# Patient Record
Sex: Female | Born: 1940
Health system: Southern US, Community
[De-identification: ages and names within clinical notes are randomized; demographics above are authoritative.]

## PROBLEM LIST (undated history)

## (undated) DIAGNOSIS — Z86010 Personal history of colonic polyps: Secondary | ICD-10-CM

## (undated) DIAGNOSIS — E079 Disorder of thyroid, unspecified: Secondary | ICD-10-CM

## (undated) DIAGNOSIS — K5792 Diverticulitis of intestine, part unspecified, without perforation or abscess without bleeding: Secondary | ICD-10-CM

## (undated) DIAGNOSIS — N200 Calculus of kidney: Secondary | ICD-10-CM

## (undated) DIAGNOSIS — R05 Cough: Secondary | ICD-10-CM

## (undated) DIAGNOSIS — R059 Cough, unspecified: Secondary | ICD-10-CM

## (undated) DIAGNOSIS — K222 Esophageal obstruction: Secondary | ICD-10-CM

## (undated) DIAGNOSIS — K219 Gastro-esophageal reflux disease without esophagitis: Secondary | ICD-10-CM

## (undated) DIAGNOSIS — J439 Emphysema, unspecified: Secondary | ICD-10-CM

## (undated) DIAGNOSIS — B3781 Candidal esophagitis: Secondary | ICD-10-CM

## (undated) DIAGNOSIS — T7840XA Allergy, unspecified, initial encounter: Secondary | ICD-10-CM

## (undated) DIAGNOSIS — K589 Irritable bowel syndrome without diarrhea: Secondary | ICD-10-CM

## (undated) HISTORY — DX: Calculus of kidney: N20.0

## (undated) HISTORY — PX: ESOPHAGEAL DILATION: SHX303

## (undated) HISTORY — DX: Irritable bowel syndrome without diarrhea: K58.9

## (undated) HISTORY — DX: Candidal esophagitis: B37.81

## (undated) HISTORY — PX: TUBAL LIGATION: SHX77

## (undated) HISTORY — DX: Cough, unspecified: R05.9

## (undated) HISTORY — PX: OTHER SURGICAL HISTORY: SHX169

## (undated) HISTORY — PX: UPPER GASTROINTESTINAL ENDOSCOPY: SHX188

## (undated) HISTORY — DX: Personal history of colonic polyps: Z86.010

## (undated) HISTORY — DX: Diverticulitis of intestine, part unspecified, without perforation or abscess without bleeding: K57.92

## (undated) HISTORY — DX: Allergy, unspecified, initial encounter: T78.40XA

## (undated) HISTORY — DX: Emphysema, unspecified: J43.9

## (undated) HISTORY — PX: HYSTEROTOMY: SHX1776

## (undated) HISTORY — PX: THYROIDECTOMY, PARTIAL: SHX18

## (undated) HISTORY — DX: Cough: R05

## (undated) HISTORY — PX: COLONOSCOPY: SHX174

## (undated) HISTORY — PX: FACIAL RECONSTRUCTION SURGERY: SHX631

## (undated) HISTORY — DX: Gastro-esophageal reflux disease without esophagitis: K21.9

---

## 1998-05-08 ENCOUNTER — Other Ambulatory Visit: Admission: RE | Admit: 1998-05-08 | Discharge: 1998-05-08 | Payer: Self-pay | Admitting: *Deleted

## 2002-08-05 ENCOUNTER — Encounter: Payer: Self-pay | Admitting: Emergency Medicine

## 2002-08-05 ENCOUNTER — Emergency Department (HOSPITAL_COMMUNITY): Admission: EM | Admit: 2002-08-05 | Discharge: 2002-08-05 | Payer: Self-pay | Admitting: Emergency Medicine

## 2003-01-23 ENCOUNTER — Encounter: Payer: Self-pay | Admitting: Emergency Medicine

## 2003-01-23 ENCOUNTER — Emergency Department (HOSPITAL_COMMUNITY): Admission: EM | Admit: 2003-01-23 | Discharge: 2003-01-23 | Payer: Self-pay | Admitting: *Deleted

## 2005-12-15 ENCOUNTER — Ambulatory Visit (HOSPITAL_COMMUNITY): Admission: RE | Admit: 2005-12-15 | Discharge: 2005-12-15 | Payer: Self-pay | Admitting: Family Medicine

## 2006-03-19 ENCOUNTER — Encounter: Admission: RE | Admit: 2006-03-19 | Discharge: 2006-03-19 | Payer: Self-pay | Admitting: Family Medicine

## 2006-09-12 ENCOUNTER — Emergency Department (HOSPITAL_COMMUNITY): Admission: EM | Admit: 2006-09-12 | Discharge: 2006-09-12 | Payer: Self-pay | Admitting: Family Medicine

## 2007-02-21 ENCOUNTER — Emergency Department (HOSPITAL_COMMUNITY): Admission: EM | Admit: 2007-02-21 | Discharge: 2007-02-21 | Payer: Self-pay | Admitting: Family Medicine

## 2010-03-06 ENCOUNTER — Encounter: Admission: RE | Admit: 2010-03-06 | Discharge: 2010-03-06 | Payer: Self-pay | Admitting: Otolaryngology

## 2011-04-28 ENCOUNTER — Other Ambulatory Visit: Payer: Self-pay | Admitting: Family Medicine

## 2011-04-30 ENCOUNTER — Ambulatory Visit
Admission: RE | Admit: 2011-04-30 | Discharge: 2011-04-30 | Disposition: A | Discharge status: Home / Self Care | Payer: Medicare Other | Source: Ambulatory Visit | Attending: Family Medicine | Admitting: Family Medicine

## 2013-02-22 ENCOUNTER — Emergency Department (HOSPITAL_COMMUNITY)
Admission: EM | Admit: 2013-02-22 | Discharge: 2013-02-22 | Disposition: A | Discharge status: Home / Self Care | Payer: Medicare Other | Source: Home / Self Care

## 2013-02-22 ENCOUNTER — Encounter (HOSPITAL_COMMUNITY): Payer: Self-pay | Admitting: Emergency Medicine

## 2013-02-22 DIAGNOSIS — T07XXXA Unspecified multiple injuries, initial encounter: Secondary | ICD-10-CM

## 2013-02-22 DIAGNOSIS — W64XXXA Exposure to other animate mechanical forces, initial encounter: Secondary | ICD-10-CM

## 2013-02-22 HISTORY — DX: Disorder of thyroid, unspecified: E07.9

## 2013-02-22 MED ORDER — CLARITHROMYCIN 500 MG PO TABS: 500.0000 mg | ORAL_TABLET | Freq: Two times a day (BID) | ORAL | Status: DC

## 2013-02-22 NOTE — ED Provider Notes (Signed)
Medical screening examination/treatment/procedure(s) were performed by non-physician practitioner and as supervising physician I was immediately available for consultation/collaboration.  Leslee Home, M.D.  Reuben Likes, MD 02/22/13 2037

## 2013-02-22 NOTE — ED Provider Notes (Signed)
History     CSN: [id number]  Arrival date & time 02/22/13  1718   First MD Initiated Contact with Patient 02/22/13 1847      Chief Complaint  Patient presents with  . Animal Bite    cat scratche back of left ankle yesterday.     (Consider location/radiation/quality/duration/timing/severity/associated sxs/prior treatment) HPI Comments: 72 year old female was showing a house to a potential buyer when a cat came up behind her and either scratched her or to her. This produced 3 small puncture wounds. This occurred yesterday at 3:30 PM there is no erythema, swelling or other discoloration. There is no drainage or oozing. She clean the wound with soap and water and scrubbed it at the time. She is unsure about her T. gap that we will call her physician tomorrow regarding that. If she needs it and she will obtain it from the PCP.   Past Medical History  Diagnosis Date  . Thyroid disease     Past Surgical History  Procedure Laterality Date  . Facial reconstruction surgery      History reviewed. No pertinent family history.  History  Substance Use Topics  . Smoking status: Never Smoker   . Smokeless tobacco: Not on file  . Alcohol Use: Yes    OB History   Grav Para Term Preterm Abortions TAB SAB Ect Mult Living                  Review of Systems  Constitutional: Negative.   Respiratory: Negative.   Cardiovascular: Negative.   Gastrointestinal: Negative.   Skin: Positive for wound.  Neurological: Negative.     Allergies  Iohexol  Home Medications   Current Outpatient Rx  Name  Route  Sig  Dispense  Refill  . levothyroxine (SYNTHROID, LEVOTHROID) 88 MCG tablet   Oral   Take 88 mcg by mouth daily before breakfast.         . clarithromycin (BIAXIN) 500 MG tablet   Oral   Take 1 tablet (500 mg total) by mouth 2 (two) times daily. X 7 days   14 tablet   0     BP 108/63  Pulse 65  Temp(Src) 98.3 F (36.8 C) (Oral)  Resp 16  SpO2 99%  Physical Exam   Nursing note and vitals reviewed. Constitutional: She is oriented to person, place, and time. She appears well-developed and well-nourished. No distress.  Neck: Normal range of motion. Neck supple.  Pulmonary/Chest: Effort normal.  Musculoskeletal: Normal range of motion. She exhibits no edema.  Neurological: She is alert and oriented to person, place, and time. She exhibits normal muscle tone.  Skin: Skin is warm and dry. No erythema.  3 very small puncture wounds located in the back of the right lower leg just above the ankle. No signs of infection. No discoloration or swelling. There is no drainage from the wounds.  Psychiatric: She has a normal mood and affect.    ED Course  Procedures (including critical care time)  Labs Reviewed - No data to display No results found.   1. Cat scratch, initial encounter       MDM  Biaxin 500 mg twice a day for 7 days Keep the wound clean with soap and water daily He denies any erythema, puffiness, red streaks, drainage or other signs of infection sig medical attention promptly. Animal control was called today and traps were set out. They are investigating attempting to on the owner. Prophylactically the patient was started on antibiotics.  She has a lengthy list of medication allergies which include nearly all antibiotics with the exception of clarithromycin. Reactions to the antibiotics include angioedema, syncope, airway obstruction and hypertension.  clarithromycin will cover for strep, staph, Bartonella species however pastoral is not mentioned. Azithromycin will cover Pasteurella, possibly clarithromycin may also. For any redness, puffiness red streaks or any signs of infection seek medical care promptly.   Hayden Rasmussen, NP 02/22/13 1930

## 2013-02-22 NOTE — ED Notes (Signed)
Pt reports was scratched by a cat yesterday on the back of left leg just above left ankle. Pt denies any symptoms. Wants to be checked out.

## 2013-03-04 ENCOUNTER — Emergency Department (HOSPITAL_COMMUNITY)
Admission: EM | Admit: 2013-03-04 | Discharge: 2013-03-04 | Disposition: A | Discharge status: Home / Self Care | Payer: Medicare Other | Attending: Emergency Medicine | Admitting: Emergency Medicine

## 2013-03-04 ENCOUNTER — Encounter (HOSPITAL_COMMUNITY): Payer: Self-pay | Admitting: Emergency Medicine

## 2013-03-04 DIAGNOSIS — R51 Headache: Secondary | ICD-10-CM | POA: Insufficient documentation

## 2013-03-04 DIAGNOSIS — Z79899 Other long term (current) drug therapy: Secondary | ICD-10-CM | POA: Insufficient documentation

## 2013-03-04 DIAGNOSIS — Z87828 Personal history of other (healed) physical injury and trauma: Secondary | ICD-10-CM | POA: Insufficient documentation

## 2013-03-04 DIAGNOSIS — H6120 Impacted cerumen, unspecified ear: Secondary | ICD-10-CM | POA: Insufficient documentation

## 2013-03-04 DIAGNOSIS — R110 Nausea: Secondary | ICD-10-CM | POA: Insufficient documentation

## 2013-03-04 DIAGNOSIS — E079 Disorder of thyroid, unspecified: Secondary | ICD-10-CM | POA: Insufficient documentation

## 2013-03-04 LAB — POCT I-STAT, CHEM 8
BUN: 12 mg/dL (ref 6–23)
Calcium, Ion: 1.14 mmol/L (ref 1.13–1.30)
Creatinine, Ser: 0.8 mg/dL (ref 0.50–1.10)
Glucose, Bld: 96 mg/dL (ref 70–99)
Sodium: 141 mEq/L (ref 135–145)
TCO2: 29 mmol/L (ref 0–100)

## 2013-03-04 LAB — CBC WITH DIFFERENTIAL/PLATELET
Eosinophils Relative: 3 % (ref 0–5)
HCT: 41.9 % (ref 36.0–46.0)
Hemoglobin: 14.3 g/dL (ref 12.0–15.0)
Lymphocytes Relative: 43 % (ref 12–46)
Lymphs Abs: 3.6 10*3/uL (ref 0.7–4.0)
MCH: 30.3 pg (ref 26.0–34.0)
MCV: 88.8 fL (ref 78.0–100.0)
Monocytes Absolute: 0.8 10*3/uL (ref 0.1–1.0)
Monocytes Relative: 10 % (ref 3–12)
RBC: 4.72 MIL/uL (ref 3.87–5.11)
WBC: 8.2 10*3/uL (ref 4.0–10.5)

## 2013-03-04 NOTE — ED Notes (Addendum)
Pt alert, NAD, calm, interactive, skin W&D, resps e/u, speaking in clear complete sentences, ambulatory with steady gait, family with pt.  VSS/WNL x2, States, "feel better, nausea lessened", also "have developed back pain from sitting in w/r chair".

## 2013-03-04 NOTE — ED Notes (Signed)
Pt c/o nausea today; pt recently scratched by unknown cat; pt sts swollen glands on face; pt sts wound from cat scratch is healed; pt sts finished antibiotics on Wednesday

## 2013-03-04 NOTE — ED Provider Notes (Signed)
History    This chart was scribed for non-physician practitioner working with Glynn Octave, MD by Donne Anon, ED Scribe. This patient was seen in room TR09C/TR09C and the patient's care was started at 2221.   CSN: [id number]  Arrival date & time 03/04/13  1640   First MD Initiated Contact with Patient 03/04/13 2221      Chief Complaint  Patient presents with  . Nausea     HPI HPI Comments: Anita [name] is a 72 y.o. female who presents to the Emergency Department complaining of gradual onset nausea which began today. She states she was scratched by an unknown cat on 02/21/13 and seen in urgent care on 02/22/13 where she was prescribed 1 week of antibiotics and a tetanus shot. 3 days ago she began experiencing fatigue, "shakiness," abdominal distension (baseline, but worse than normal), headache and a knot of both sides of her cheeks that is tender to palpation. She denies fever, ear pain, rhinorrhea, sore throat, diarrhea, vomiting, or any other pain. Her last DM was this morning and was normal.  Past Medical History  Diagnosis Date  . Thyroid disease     Past Surgical History  Procedure Laterality Date  . Facial reconstruction surgery      History reviewed. No pertinent family history.  History  Substance Use Topics  . Smoking status: Never Smoker   . Smokeless tobacco: Not on file  . Alcohol Use: Yes     Review of Systems  Constitutional: Negative for fever.  HENT: Negative for ear pain, sore throat and rhinorrhea.   Gastrointestinal: Positive for nausea. Negative for vomiting and diarrhea.  Neurological: Positive for headaches.    Allergies  Codeine; Minocin; Peanut-containing drug products; Penicillins; Sulfa antibiotics; Iohexol; Latex; and Other  Home Medications   Current Outpatient Rx  Name  Route  Sig  Dispense  Refill  . conjugated estrogens (PREMARIN) vaginal cream   Vaginal   Place 0.5 g vaginally 3 (three) times a week.         Marland Kitchen ibuprofen  (ADVIL,MOTRIN) 200 MG tablet   Oral   Take 400 mg by mouth every 6 (six) hours as needed for pain.         Marland Kitchen levothyroxine (SYNTHROID, LEVOTHROID) 88 MCG tablet   Oral   Take 88 mcg by mouth daily before breakfast.         . clarithromycin (BIAXIN) 500 MG tablet   Oral   Take 500 mg by mouth 2 (two) times daily.           BP 148/68  Pulse 64  Temp(Src) 98.5 F (36.9 C) (Oral)  Resp 16  SpO2 95%  Physical Exam  Nursing note and vitals reviewed. Constitutional: She is oriented to person, place, and time. She appears well-developed and well-nourished. No distress.  HENT:  Head: Normocephalic and atraumatic.  Bilateral cerum impaction. No swelling over right face noted.  tenderness to palpation over her right preauricular area. No trismus. No erythema  Eyes: Conjunctivae and EOM are normal. Pupils are equal, round, and reactive to light.  Neck: Normal range of motion. Neck supple. No tracheal deviation present.  Cardiovascular: Normal rate, regular rhythm and normal heart sounds.   No murmur heard. Pulmonary/Chest: Effort normal and breath sounds normal. No respiratory distress. She has no wheezes. She has no rales.  Abdominal: Soft. Bowel sounds are normal. She exhibits no distension. There is no tenderness.  Musculoskeletal: Normal range of motion.  Lymphadenopathy:    She  has no cervical adenopathy.  Neurological: She is alert and oriented to person, place, and time.  Skin: Skin is warm and dry.  Psychiatric: She has a normal mood and affect. Her behavior is normal.    ED Course  Procedures (including critical care time) DIAGNOSTIC STUDIES: Oxygen Saturation is 95% on room air, adequate by my interpretation.    COORDINATION OF CARE: 10:21 PM Discussed treatment plan which includes labs with pt at bedside and pt agreed to plan. Will consult with Dr. Manus Gunning.    10:33 PM Consulted with Dr. Manus Gunning who wants to visit the pt. He supports lab work.  Results for  orders placed during the hospital encounter of 03/04/13  CBC WITH DIFFERENTIAL      Result Value Range   WBC 8.2  4.0 - 10.5 K/uL   RBC 4.72  3.87 - 5.11 MIL/uL   Hemoglobin 14.3  12.0 - 15.0 g/dL   HCT 29.5  62.1 - 30.8 %   MCV 88.8  78.0 - 100.0 fL   MCH 30.3  26.0 - 34.0 pg   MCHC 34.1  30.0 - 36.0 g/dL   RDW 65.7  84.6 - 96.2 %   Platelets 282  150 - 400 K/uL   Neutrophils Relative % 43  43 - 77 %   Neutro Abs 3.5  1.7 - 7.7 K/uL   Lymphocytes Relative 43  12 - 46 %   Lymphs Abs 3.6  0.7 - 4.0 K/uL   Monocytes Relative 10  3 - 12 %   Monocytes Absolute 0.8  0.1 - 1.0 K/uL   Eosinophils Relative 3  0 - 5 %   Eosinophils Absolute 0.3  0.0 - 0.7 K/uL   Basophils Relative 1  0 - 1 %   Basophils Absolute 0.1  0.0 - 0.1 K/uL  POCT I-STAT, CHEM 8      Result Value Range   Sodium 141  135 - 145 mEq/L   Potassium 4.0  3.5 - 5.1 mEq/L   Chloride 104  96 - 112 mEq/L   BUN 12  6 - 23 mg/dL   Creatinine, Ser 9.52  0.50 - 1.10 mg/dL   Glucose, Bld 96  70 - 99 mg/dL   Calcium, Ion 8.41  3.24 - 1.30 mmol/L   TCO2 29  0 - 100 mmol/L   Hemoglobin 14.3  12.0 - 15.0 g/dL   HCT 40.1  02.7 - 25.3 %   No results found.    1. Nausea   2. Facial pain       MDM  Pt appears well, joking, cheerful, no distress. No exam finding. No current complaints other than mild preauricular tenderness. She is afebrile. No problems with cat scratch, healed completely. No vomiting, no abdominal pain, no chest pain, no headache, no visual changes, no neck pain or stiffness. Lab work normal. Symptoms non specific. Pt did admit at the end that she is here because she was told she needed to follow up after her antibiotic treatment. Pt appears well with no acute emergent illnesses. Will d/c home with follow up with pcp. Discussed with dr. Manus Gunning, who has seen pt and agrees.   Filed Vitals:   03/04/13 1650 03/04/13 2213 03/04/13 2231 03/04/13 2333  BP: 143/75 148/68 132/73 134/65  Pulse: 78 64 59 74  Temp:  97.9 F (36.6 C) 98.5 F (36.9 C) 98.1 F (36.7 C) 98.2 F (36.8 C)  TempSrc: Oral Oral Oral Oral  Resp: 18 16 16  16  SpO2: 97% 95% 98% 99%     I personally performed the services described in this documentation, which was scribed in my presence. The recorded information has been reviewed and is accurate.        Lottie Mussel, PA-C 03/05/13 0036

## 2013-03-05 NOTE — ED Provider Notes (Signed)
Medical screening examination/treatment/procedure(s) were conducted as a shared visit with non-physician practitioner(s) and myself.  I personally evaluated the patient during the encounter  Nausea, now resolved. Recent cat scratch on 5/5.  Appears very well, joking in room.  No LAD, no appreciable cellulitis or abscess.  Glynn Octave, MD 03/05/13 [phone]

## 2014-11-06 ENCOUNTER — Emergency Department (HOSPITAL_COMMUNITY): Payer: Medicare Other

## 2014-11-06 ENCOUNTER — Emergency Department (HOSPITAL_COMMUNITY)
Admission: EM | Admit: 2014-11-06 | Discharge: 2014-11-06 | Disposition: A | Discharge status: Home / Self Care | Payer: Medicare Other | Attending: Emergency Medicine | Admitting: Emergency Medicine

## 2014-11-06 ENCOUNTER — Encounter (HOSPITAL_COMMUNITY): Payer: Self-pay | Admitting: Emergency Medicine

## 2014-11-06 DIAGNOSIS — Y998 Other external cause status: Secondary | ICD-10-CM | POA: Diagnosis not present

## 2014-11-06 DIAGNOSIS — Z880 Allergy status to penicillin: Secondary | ICD-10-CM | POA: Insufficient documentation

## 2014-11-06 DIAGNOSIS — S42022A Displaced fracture of shaft of left clavicle, initial encounter for closed fracture: Secondary | ICD-10-CM | POA: Insufficient documentation

## 2014-11-06 DIAGNOSIS — Y92410 Unspecified street and highway as the place of occurrence of the external cause: Secondary | ICD-10-CM | POA: Insufficient documentation

## 2014-11-06 DIAGNOSIS — S42002A Fracture of unspecified part of left clavicle, initial encounter for closed fracture: Secondary | ICD-10-CM

## 2014-11-06 DIAGNOSIS — S4992XA Unspecified injury of left shoulder and upper arm, initial encounter: Secondary | ICD-10-CM | POA: Diagnosis present

## 2014-11-06 DIAGNOSIS — Z91040 Latex allergy status: Secondary | ICD-10-CM | POA: Diagnosis not present

## 2014-11-06 DIAGNOSIS — E079 Disorder of thyroid, unspecified: Secondary | ICD-10-CM | POA: Diagnosis not present

## 2014-11-06 DIAGNOSIS — Y9389 Activity, other specified: Secondary | ICD-10-CM | POA: Insufficient documentation

## 2014-11-06 DIAGNOSIS — Z79899 Other long term (current) drug therapy: Secondary | ICD-10-CM | POA: Insufficient documentation

## 2014-11-06 MED ORDER — HYDROCODONE-ACETAMINOPHEN 5-325 MG PO TABS
1.0000 | ORAL_TABLET | ORAL | Status: DC | PRN
Start: 2014-11-06 — End: 2015-08-21

## 2014-11-06 NOTE — Discharge Instructions (Signed)
Clavicle Fracture °The clavicle, also called the collarbone, is the long bone that connects your shoulder to your rib cage. You can feel your collarbone at the top of your shoulders and rib cage. A clavicle fracture is a broken clavicle. It is a common injury that can happen at any age.  °CAUSES °Common causes of a clavicle fracture include: °· A direct blow to your shoulder. °· A car accident. °· A fall, especially if you try to break your fall with an outstretched arm. °RISK FACTORS °You may be at increased risk if: °· You are younger than 25 years or older than 75 years. Most clavicle fractures happen to people who are younger than 25 years. °· You are a female. °· You play contact sports. °SIGNS AND SYMPTOMS °A fractured clavicle is painful. It also makes it hard to move your arm. Other signs and symptoms may include: °· A shoulder that drops downward and forward. °· Pain when trying to lift your shoulder. °· Bruising, swelling, and tenderness over your clavicle. °· A grinding noise when you try to move your shoulder. °· A bump over your clavicle. °DIAGNOSIS °Your health care provider can usually diagnose a clavicle fracture by asking about your injury and examining your shoulder and clavicle. He or she may take an X-ray to determine the position of your clavicle. °TREATMENT °Treatment depends on the position of your clavicle after the fracture: °· If the broken ends of the bone are not out of place, your health care provider may put your arm in a sling or wrap a support bandage around your chest (figure-of-eight wrap). °· If the broken ends of the bone are out of place, you may need surgery. Surgery may involve placing screws, pins, or plates to keep your clavicle stable while it heals. Healing may take about 3 months. °When your health care provider thinks your fracture has healed enough, you may have to do physical therapy to regain normal movement and build up your arm strength. °HOME CARE INSTRUCTIONS   °· Apply ice to the injured area: °¨ Put ice in a plastic bag. °¨ Place a towel between your skin and the bag. °¨ Leave the ice on for 20 minutes, 2-3 times a day. °· If you have a wrap or splint: °¨ Wear it all the time, and remove it only to take a bath or shower. °¨ When you bathe or shower, keep your shoulder in the same position as when the sling or wrap is on. °¨ Do not lift your arm. °· If you have a figure-of-eight wrap: °¨ Another person must tighten it every day. °¨ It should be tight enough to hold your shoulders back. °¨ Allow enough room to place your index finger between your body and the strap. °¨ Loosen the wrap immediately if you feel numbness or tingling in your hands. °· Only take medicines as directed by your health care provider. °· Avoid activities that make the injury or pain worse for 4-6 weeks after surgery. °· Keep all follow-up appointments. °SEEK MEDICAL CARE IF:  °Your medicine is not helping to relieve pain and swelling. °SEEK IMMEDIATE MEDICAL CARE IF:  °Your arm is numb, cold, or pale, even when the splint is loose. °MAKE SURE YOU:  °· Understand these instructions. °· Will watch your condition. °· Will get help right away if you are not doing well or get worse. °Document Released: 07/16/2005 Document Revised: 10/11/2013 Document Reviewed: 08/29/2013 °[employer]® Patient Information ©2015 [employer]. This information is   not intended to replace advice given to you by your health care provider. Make sure you discuss any questions you have with your health care provider.
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
## 2014-11-06 NOTE — ED Notes (Signed)
Pt clothes changed, shoulder immobilizer placed on patient left shoulder.

## 2015-07-26 ENCOUNTER — Telehealth: Payer: Self-pay | Admitting: Internal Medicine

## 2015-07-26 NOTE — Telephone Encounter (Signed)
Left message for patient to call back  

## 2015-07-31 NOTE — Telephone Encounter (Signed)
Left message for patient to call back  

## 2015-08-01 NOTE — Telephone Encounter (Signed)
Attempted call again .  No return call from the patient.  She is asked via voicemail to call back if she still needs [place].

## 2015-08-05 ENCOUNTER — Emergency Department (HOSPITAL_BASED_OUTPATIENT_CLINIC_OR_DEPARTMENT_OTHER): Payer: Medicare Other

## 2015-08-05 ENCOUNTER — Emergency Department (HOSPITAL_BASED_OUTPATIENT_CLINIC_OR_DEPARTMENT_OTHER)
Admission: EM | Admit: 2015-08-05 | Discharge: 2015-08-05 | Disposition: A | Discharge status: Home / Self Care | Payer: Medicare Other | Attending: Emergency Medicine | Admitting: Emergency Medicine

## 2015-08-05 ENCOUNTER — Encounter (HOSPITAL_BASED_OUTPATIENT_CLINIC_OR_DEPARTMENT_OTHER): Payer: Self-pay | Admitting: Emergency Medicine

## 2015-08-05 DIAGNOSIS — Z79899 Other long term (current) drug therapy: Secondary | ICD-10-CM | POA: Insufficient documentation

## 2015-08-05 DIAGNOSIS — R0989 Other specified symptoms and signs involving the circulatory and respiratory systems: Secondary | ICD-10-CM | POA: Diagnosis not present

## 2015-08-05 DIAGNOSIS — E079 Disorder of thyroid, unspecified: Secondary | ICD-10-CM | POA: Diagnosis not present

## 2015-08-05 DIAGNOSIS — R1310 Dysphagia, unspecified: Secondary | ICD-10-CM | POA: Insufficient documentation

## 2015-08-05 DIAGNOSIS — Z91040 Latex allergy status: Secondary | ICD-10-CM | POA: Diagnosis not present

## 2015-08-05 DIAGNOSIS — Z880 Allergy status to penicillin: Secondary | ICD-10-CM | POA: Diagnosis not present

## 2015-08-05 NOTE — ED Notes (Signed)
Pt in c/o dysphagia after being at the dentist having a bridge placed and accidentally swallowed some of the adhesive used in the procedure x 7-8 weeks ago. Has seen her primary several times and referred to surgeon. Pt states she can't eat anything, that it all gets stuck but she does not cough or throw anything back up. Pt is speaking well in complete sentences with no difficulty or distress, controlling saliva, breathing equal and unlabored. RRT to bedside to assess.

## 2015-08-05 NOTE — ED Provider Notes (Signed)
CSN: [id number]     Arrival date & time 08/05/15  1330 History   First MD Initiated Contact with Patient 08/05/15 1334     Chief Complaint  Patient presents with  . Dysphagia  . Swallowed Foreign Body     (Consider location/radiation/quality/duration/timing/severity/associated sxs/prior Treatment) HPI 74 year old female who presents with difficulty swallowing. Reports that this is an ongoing issue now for several weeks. She has been having some difficulty swallowing more solid foods. Reports that after she eats she has sensation that something is stuck towards the back of her throat. Has only been able to drink liquids without difficulty. Has not had any difficulty breathing, but has noted increased cough without sputum. She has not had any throat swelling and has not had difficulty swallowing her saliva. Denies any vomiting or hoarseness. Denies any swelling in the back of her throat. Has not had any fevers or chills. Has follow-up with GI in November for evaluation.  Past Medical History  Diagnosis Date  . Thyroid disease    Past Surgical History  Procedure Laterality Date  . Facial reconstruction surgery     History reviewed. No pertinent family history. Social History  Substance Use Topics  . Smoking status: Never Smoker   . Smokeless tobacco: None  . Alcohol Use: Yes   OB History    No data available     Review of Systems 10/14 systems reviewed and are negative other than those stated in the HPI    Allergies  Codeine; Minocin; Peanut-containing drug products; Penicillins; Sulfa antibiotics; Iohexol; Latex; and Other  Home Medications   Prior to Admission medications   Medication Sig Start Date End Date Taking? Authorizing Provider  ESTRACE VAGINAL 0.1 MG/GM vaginal cream Place 1 Applicatorful vaginally 2 (two) times a week.  10/29/14   Historical Provider, MD  HYDROcodone-acetaminophen (NORCO/VICODIN) 5-325 MG per tablet Take 1 tablet by mouth every 4 (four) hours  as needed. 11/06/14   Tanna Furry, MD  ibuprofen (ADVIL,MOTRIN) 100 MG/5ML suspension Take 400 mg by mouth every 8 (eight) hours as needed for moderate pain.    Historical Provider, MD  levothyroxine (SYNTHROID, LEVOTHROID) 88 MCG tablet Take 88 mcg by mouth daily before breakfast.    Historical Provider, MD   BP 119/70 mmHg  Pulse 68  Temp(Src) 98.3 F (36.8 C) (Oral)  Resp 19  Ht 5\' 2"  (1.575 m)  Wt 130 lb (58.968 kg)  BMI 23.77 kg/m2  SpO2 98% Physical Exam Physical Exam  Nursing note and vitals reviewed. Constitutional: Well developed, well nourished, non-toxic, and in no acute distress Head: Normocephalic and atraumatic.  Mouth/Throat: Oropharynx is clear and moist.  Neck: Normal range of motion. Neck supple.  Cardiovascular: Normal rate and regular rhythm.   Pulmonary/Chest: Effort normal and breath sounds normal.  Abdominal: Soft. There is no tenderness. There is no rebound and no guarding.  Musculoskeletal: Normal range of motion.  Neurological: Alert, no facial droop, fluent speech, moves all extremities symmetrically Skin: Skin is warm and dry.  Psychiatric: Cooperative   ED Course  Procedures (including critical care time) Labs Review Labs Reviewed - No data to display  Imaging Review Dg Chest 2 View  08/05/2015  CLINICAL DATA:  Productive cough and shortness of breath. Dysphagia. EXAM: CHEST  2 VIEW COMPARISON:  11/06/2014 FINDINGS: The heart size and mediastinal contours are within normal limits. Both lungs are clear. No evidence of pleural effusion or pneumothorax. Healing of distal left clavicle fracture seen since previous study . IMPRESSION: No  active cardiopulmonary disease. Electronically Signed   By: Earle Gell M.D.   On: 08/05/2015 14:31   I have personally reviewed and evaluated these images and lab results as part of my medical decision-making.   MDM   Final diagnoses:  Difficulty swallowing solids    74 year old female who presents with several  week history of difficulty swallowing solids. Well-appearing and in no acute distress on presentation. Vital signs are non-concerning. Lungs are clear to auscultation bilaterally. She is protecting her airway and handling her secretions without difficulty. Normal range of motion of her neck as well. No appreciable swelling or masses are noted. Seems more consistent with esophageal dysmotility disorder. Does not vomit or spit up undigested food, making diverticulum less likely. Chest x-ray shows no evidence of aspiration pneumonia or other acute processes. Has scheduled follow-up with gastroenterology for additional workup. She will continue to do this as an outpatient. Strict return instructions are also reviewed. She expressed understanding of all discharge instructions and felt comfortable to plan of care.    Forde Dandy, MD 08/05/15 [phone]

## 2015-08-05 NOTE — Discharge Instructions (Signed)
Please follow-up with gastroenterology as scheduled for further workup of your difficulty swallowing. Return for worsening symptoms including fever, difficulty breathing, swelling in your throat, or any other symptoms concerning to you.  Dysphagia Swallowing problems (dysphagia) occur when solids and liquids seem to stick in your throat on the way down to your stomach, or the food takes longer to get to the stomach. Other symptoms include regurgitating food, noises coming from the throat, chest discomfort with swallowing, and a feeling of fullness or the feeling of something being stuck in your throat when swallowing. When blockage in your throat is complete, it may be associated with drooling. CAUSES  Problems with swallowing may occur because of problems with the muscles. The food cannot be propelled in the usual manner into your stomach. You may have ulcers, scar tissue, or inflammation in the tube down which food travels from your mouth to your stomach (esophagus), which blocks food from passing normally into the stomach. Causes of inflammation include:  Acid reflux from your stomach into your esophagus.  Infection.  Radiation treatment for cancer.  Medicines taken without enough fluids to wash them down into your stomach. You may have nerve problems that prevent signals from being sent to the muscles of your esophagus to contract and move your food down to your stomach. Globus pharyngeus is a relatively common problem in which there is a sense of an obstruction or difficulty in swallowing, without any physical abnormalities of the swallowing passages being found. This problem usually improves over time with reassurance and testing to rule out other causes. DIAGNOSIS Dysphagia can be diagnosed and its cause can be determined by tests in which you swallow a white substance that helps illuminate the inside of your throat (contrast medium) while X-rays are taken. Sometimes a flexible telescope that  is inserted down your throat (endoscopy) to look at your esophagus and stomach is used. TREATMENT   If the dysphagia is caused by acid reflux or infection, medicines may be used.  If the dysphagia is caused by problems with your swallowing muscles, swallowing therapy may be used to help you strengthen your swallowing muscles.  If the dysphagia is caused by a blockage or mass, procedures to remove the blockage may be done. HOME CARE INSTRUCTIONS  Try to eat soft food that is easier to swallow and check your weight on a daily basis to be sure that it is not decreasing.  Be sure to drink liquids when sitting upright (not lying down). SEEK MEDICAL CARE IF:  You are losing weight because you are unable to swallow.  You are coughing when you drink liquids (aspiration).  You are coughing up partially digested food. SEEK IMMEDIATE MEDICAL CARE IF:  You are unable to swallow your own saliva .  You are having shortness of breath or a fever, or both.  You have a hoarse voice along with difficulty swallowing. MAKE SURE YOU:  Understand these instructions.  Will watch your condition.  Will get help right away if you are not doing well or get worse.   This information is not intended to replace advice given to you by your health care provider. Make sure you discuss any questions you have with your health care provider.   Document Released: 10/03/2000 Document Revised: 10/27/2014 Document Reviewed: 03/25/2013 Elsevier Interactive Patient Education [employer].

## 2015-08-09 ENCOUNTER — Other Ambulatory Visit: Payer: Self-pay | Admitting: Otolaryngology

## 2015-08-09 DIAGNOSIS — K219 Gastro-esophageal reflux disease without esophagitis: Secondary | ICD-10-CM

## 2015-08-21 ENCOUNTER — Ambulatory Visit (INDEPENDENT_AMBULATORY_CARE_PROVIDER_SITE_OTHER): Payer: Medicare Other | Admitting: Physician Assistant

## 2015-08-21 ENCOUNTER — Encounter: Payer: Self-pay | Admitting: Physician Assistant

## 2015-08-21 VITALS — BP 110/60 | HR 62 | Ht 62.0 in | Wt 128.0 lb

## 2015-08-21 DIAGNOSIS — R634 Abnormal weight loss: Secondary | ICD-10-CM | POA: Diagnosis not present

## 2015-08-21 DIAGNOSIS — R1310 Dysphagia, unspecified: Secondary | ICD-10-CM | POA: Diagnosis not present
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
## 2015-08-21 NOTE — Patient Instructions (Addendum)
You have been scheduled for an endoscopy. Please follow written instructions given to you at your visit today. If you use inhalers (even only as needed), please bring them with you on the day of your procedure. Your physician has requested that you go to www.startemmi.com and enter the access code given to you at your visit today. This web site gives a general overview about your procedure. However, you should still follow specific instructions given to you by our office regarding your preparation for the procedure.  Full liquid diet for now. Elevated the head of your bed.

## 2015-08-28 ENCOUNTER — Ambulatory Visit (AMBULATORY_SURGERY_CENTER): Payer: Medicare Other | Admitting: Internal Medicine

## 2015-08-28 ENCOUNTER — Encounter: Payer: Self-pay | Admitting: Internal Medicine

## 2015-08-28 VITALS — BP 111/56 | HR 63 | Temp 97.5°F | Resp 25 | Ht 62.0 in | Wt 128.0 lb

## 2015-08-28 DIAGNOSIS — K222 Esophageal obstruction: Secondary | ICD-10-CM | POA: Diagnosis not present

## 2015-08-28 DIAGNOSIS — R1310 Dysphagia, unspecified: Secondary | ICD-10-CM | POA: Diagnosis present

## 2015-08-28 MED ORDER — SODIUM CHLORIDE 0.9 % IV SOLN: 500.0000 mL | INTRAVENOUS | Status: DC

## 2015-08-28 NOTE — Op Note (Signed)
[place]  [address]. [place], [address]   ENDOSCOPY PROCEDURE REPORT  PATIENT: [name], Anita Stacy  MR#: [id number] BIRTHDATE: [birth year] , 74  yrs. old GENDER: female ENDOSCOPIST: Gatha Mayer, MD, [facility] PROCEDURE DATE:  08/28/2015 PROCEDURE:  EGD w/ balloon dilation ASA CLASS:     Class III INDICATIONS:  dysphagia. MEDICATIONS: Propofol 300 mg IV and Monitored anesthesia care TOPICAL ANESTHETIC: none  DESCRIPTION OF PROCEDURE: After the risks benefits and alternatives of the procedure were thoroughly explained, informed consent was obtained.  The LB [id number] endoscope was introduced through the mouth and advanced to the second portion of the duodenum , Without limitations.  The instrument was slowly withdrawn as the mucosa was fully examined.    1) Ring at [place] - dilated to 20 mm w/ 18-19-20 mm balloon. Good result 2) Otherwise normal EGD.  Retroflexed views revealed no abnormalities.     The scope was then withdrawn from the patient and the procedure completed.  COMPLICATIONS: There were no immediate complications.  ENDOSCOPIC IMPRESSION: 1) Ring at [place] - dilated to 20 mm w/ 18-19-20 mm balloon. Good result 2) Otherwise normal EGD  RECOMMENDATIONS: Clear liquids until 5 PM  , then soft foods rest of day.  Resume prior (solids) diet tomorrow. If this does not relieve all dysphagia she is to call back and I would do a ba swallow w/ tablet   eSigned:  Gatha Mayer, MD, [facility] 08/28/2015 3:42 PM    CC:Sam Claiborne Billings, MD and The Patient

## 2015-08-28 NOTE — Progress Notes (Signed)
Called to room to assist during endoscopic procedure.  Patient ID and intended procedure confirmed with present staff. Received instructions for my participation in the procedure from the performing physician.  

## 2015-08-28 NOTE — Patient Instructions (Addendum)
I saw and dilated a ring (scar tissue) in the esophagus. If this does not fix your swallowing problems call me back - it is possible it was not the only problem.  I appreciate the opportunity to care for you. Gatha Mayer, MD, [facility]   Discharge instructions given. Handout on a dilatation diet. Resume previous medications. YOU HAD AN ENDOSCOPIC PROCEDURE TODAY AT [facility]:   Refer to the procedure report that was given to you for any specific questions about what was found during the examination.  If the procedure report does not answer your questions, please call your gastroenterologist to clarify.  If you requested that your care partner not be given the details of your procedure findings, then the procedure report has been included in a sealed envelope for you to review at your convenience later.  YOU SHOULD EXPECT: Some feelings of bloating in the abdomen. Passage of more gas than usual.  Walking can help get rid of the air that was put into your GI tract during the procedure and reduce the bloating. If you had a lower endoscopy (such as a colonoscopy or flexible sigmoidoscopy) you may notice spotting of blood in your stool or on the toilet paper. If you underwent a bowel prep for your procedure, you may not have a normal bowel movement for a few days.  Please Note:  You might notice some irritation and congestion in your nose or some drainage.  This is from the oxygen used during your procedure.  There is no need for concern and it should clear up in a day or so.  SYMPTOMS TO REPORT IMMEDIATELY:    Following upper endoscopy (EGD)  Vomiting of blood or coffee ground material  New chest pain or pain under the shoulder blades  Painful or persistently difficult swallowing  New shortness of breath  Fever of 100F or higher  Black, tarry-looking stools  For urgent or emergent issues, a gastroenterologist can be reached at any hour by calling [phone]  [phone].   DIET: Your first meal following the procedure should be a small meal and then it is ok to progress to your normal diet. Heavy or fried foods are harder to digest and may make you feel nauseous or bloated.  Likewise, meals heavy in dairy and vegetables can increase bloating.  Drink plenty of fluids but you should avoid alcoholic beverages for 24 hours.  ACTIVITY:  You should plan to take it easy for the rest of today and you should NOT DRIVE or use heavy machinery until tomorrow (because of the sedation medicines used during the test).    FOLLOW UP: Our staff will call the number listed on your records the next business day following your procedure to check on you and address any questions or concerns that you may have regarding the information given to you following your procedure. If we do not reach you, we will leave a message.  However, if you are feeling well and you are not experiencing any problems, there is no need to return our call.  We will assume that you have returned to your regular daily activities without incident.  If any biopsies were taken you will be contacted by phone or by letter within the next 1-3 weeks.  Please call us at [phone] if you have not heard about the biopsies in 3 weeks.    SIGNATURES/CONFIDENTIALITY: You and/or your care partner have signed paperwork which will be entered into your electronic medical  record.  These signatures attest to the fact that that the information above on your After Visit Summary has been reviewed and is understood.  Full responsibility of the confidentiality of this discharge information lies with you and/or your care-partner. 

## 2015-08-28 NOTE — Progress Notes (Signed)
To recovery, report to McCoy, RN, VSS 

## 2015-08-29 ENCOUNTER — Telehealth: Payer: Self-pay | Admitting: *Deleted

## 2015-08-29 NOTE — Telephone Encounter (Signed)
  Follow up Call-  Call back number 08/28/2015  Post procedure Call Back phone  # [phone]  Permission to leave phone message Yes     Patient questions:  Do you have a fever, pain , or abdominal swelling? No. Pain Score  0 *  Have you tolerated food without any problems? Yes.    Have you been able to return to your normal activities? Yes.    Do you have any questions about your discharge instructions: Diet   No Medications  No. Follow up visit  No.  Do you have questions or concerns about your Care? Yes.    Actions: * If pain score is 4 or above: No action needed, pain <4.  Patient does state that she feels the food is still sticking in the back of her throat. Informed her she may have a little swelling but by exam dilation results were good. Call if not improved.

## 2015-08-29 NOTE — Progress Notes (Signed)
Agree with Ms. Esterwood's assessment and plan. Aleshia Cartelli E. Gelsey Amyx, MD, FACG   

## 2015-08-31 ENCOUNTER — Telehealth: Payer: Self-pay | Admitting: Internal Medicine

## 2015-08-31 DIAGNOSIS — R1310 Dysphagia, unspecified: Secondary | ICD-10-CM

## 2015-08-31 NOTE — Telephone Encounter (Signed)
Patient reports that her dysphagia has not improved got choked on chopped stake yesterday.  Per procedure report if dysphagia continues she is to have a BS with Tab.  She is scheduled for BS with tab on 09/04/15 11:30.  She is advised to arrive at 11:15 and be NPO for 3 hours prior

## 2015-08-31 NOTE — Telephone Encounter (Signed)
ok 

## 2015-09-04 ENCOUNTER — Ambulatory Visit (HOSPITAL_COMMUNITY)
Admission: RE | Admit: 2015-09-04 | Discharge: 2015-09-04 | Disposition: A | Discharge status: Home / Self Care | Payer: Medicare Other | Source: Ambulatory Visit | Attending: Internal Medicine | Admitting: Internal Medicine

## 2015-09-04 DIAGNOSIS — R1310 Dysphagia, unspecified: Secondary | ICD-10-CM | POA: Insufficient documentation

## 2015-09-04 DIAGNOSIS — K224 Dyskinesia of esophagus: Secondary | ICD-10-CM | POA: Diagnosis not present

## 2015-09-05 NOTE — Progress Notes (Signed)
Quick Note:  There is mild dysmotility of the esophagus which i think is cause of her problems in swallowing. Diet modification can help this  Dysphagia 3 diet  See me again in Jan/Feb  If she is interested in setting up a screening colonoscopy 9likely her last) could do it that way.  Gatha Mayer, MD, [facility]  ______

## 2015-09-20 ENCOUNTER — Ambulatory Visit (INDEPENDENT_AMBULATORY_CARE_PROVIDER_SITE_OTHER): Payer: Medicare Other | Admitting: Internal Medicine

## 2015-09-20 ENCOUNTER — Encounter: Payer: Self-pay | Admitting: Internal Medicine

## 2015-09-20 VITALS — BP 122/68 | HR 69 | Ht 62.0 in | Wt 123.4 lb

## 2015-09-20 DIAGNOSIS — R1314 Dysphagia, pharyngoesophageal phase: Secondary | ICD-10-CM | POA: Insufficient documentation

## 2015-09-20 DIAGNOSIS — R05 Cough: Secondary | ICD-10-CM

## 2015-09-20 DIAGNOSIS — R058 Other specified cough: Secondary | ICD-10-CM | POA: Insufficient documentation

## 2015-09-20 MED ORDER — METHYLPREDNISOLONE ACETATE 80 MG/ML IJ SUSP
120.0000 mg | Freq: Once | INTRAMUSCULAR | Status: AC
Start: 2015-09-20 — End: 2015-09-20
  Administered 2015-09-20: 120 mg via INTRAMUSCULAR

## 2015-09-20 NOTE — Patient Instructions (Addendum)
Depomedrol 120 mg IM   Please see patient coordinator before you leave today  to schedule [facility] voice center next available   GERD (REFLUX)  is an extremely common cause of respiratory symptoms just like yours , many times with no obvious heartburn at all.    It can be treated with medication, but also with lifestyle changes including elevation of the head of your bed (ideally with 6 inch  bed blocks),  Smoking cessation, avoidance of late meals, excessive alcohol, and avoid fatty foods, chocolate, peppermint, colas, red wine, and acidic juices such as orange juice.  NO MINT OR MENTHOL PRODUCTS SO NO COUGH DROPS  USE SUGARLESS CANDY INSTEAD (Jolley ranchers or Stover's or Life Savers) or even ice chips will also do - the key is to swallow to prevent all throat clearing. NO OIL BASED VITAMINS - use powdered substitutes.

## 2015-09-20 NOTE — Progress Notes (Signed)
Subjective:    Patient ID: Anita [name], female    DOB: 05/02/1941    MRN: [id number]  HPI   73 yowf quit smoking in 1971 no resp problems at all until had dental work done in Aug 2016 felt nl x 2 weeks then exp to mold at a property with loss of voice and coughing / choking > primary care doctor > rx pred/ prilosec but did not take it > GI eval with mild presbyesophagus on DgEs 09/04/15 GI rec dysphagia 3 diet and f/u 11/12/15 no better so self referred to pulmonary clinic 09/20/2015      09/20/2015 1st [facility] office visit/ [facility]   Chief Complaint  Patient presents with  . PULMONARY CONSULT    Self-Referred. Pt c/o cough with thick green mucus which turns to clear later in the day, choking sensation and trouble swallowing solid foods. Pt also c/o some wheeze/SOB that she was told was aspiration of food when she chokes. Pt states that she is unable to swallow anything including medications. Pt also c/o dizziness and seeing "stars" which she thinks may be BPO related.   convinced she is aspirating and was told this was due to "weak neck muscles" can't swallow anything solid at all, refuse to attempt to do so at DgEs per the notes, struggles to swallow tiny synthroid pill with applesauce.  All of her symptoms are during the day assoc with swallowing which causes choking/ gagging and have occurred in the setting of incessant throat clearing ever since exposed to mold but this only happens during the day and only exposed x one.  Does report better p cortisone shot but never repeated this and is supposed to be taking ppi but can't swallow the pills.  ent eval c/w gerd previously but EGD done 09/07/15 Ring at [facility] - dilated to 20 mm w/ 18-19-20 mm balloon.  Otherwise nl exam  No obvious other patterns in   day to day or daytime variability or assoc   cp or chest tightness,  overt hb symptoms. No unusual exp hx or h/o childhood pna/ asthma or knowledge of premature birth.  Sleeping ok  without nocturnal  or early am exacerbation  of respiratory  c/o's or need for noct saba. Also denies any obvious fluctuation of symptoms with weather or environmental changes or other aggravating or alleviating factors except as outlined above   Current Medications, Allergies, Complete Past Medical History, Past Surgical History, Family History, and Social History were reviewed in [employer] record.        Review of Systems  Constitutional: Negative.  Negative for fever and unexpected weight change.  HENT: Positive for congestion and trouble swallowing. Negative for dental problem, ear pain, nosebleeds, postnasal drip, rhinorrhea, sinus pressure, sneezing and sore throat.   Eyes: Negative.  Negative for redness and itching.  Respiratory: Positive for cough, shortness of breath and wheezing. Negative for chest tightness.   Cardiovascular: Negative.  Negative for palpitations and leg swelling.  Gastrointestinal: Negative.  Negative for nausea and vomiting.  Endocrine: Negative.   Genitourinary: Negative.  Negative for dysuria.  Musculoskeletal: Negative.  Negative for joint swelling.  Skin: Negative.  Negative for rash.  Allergic/Immunologic: Positive for environmental allergies.  Neurological: Negative.  Negative for headaches.  Hematological: Bruises/bleeds easily.  Psychiatric/Behavioral: Negative.  Negative for dysphoric mood. The patient is not nervous/anxious.        Objective:   Physical Exam  amb wf with classic voice fatigue/  constant throat clearing   Wt Readings from Last 3 Encounters:  09/20/15 123 lb 6.4 oz (55.974 kg)  08/28/15 128 lb (58.06 kg)  08/21/15 128 lb (58.06 kg)    Vital signs reviewed   HEENT: nl dentition, turbinates, and oropharynx which is pristine. Nl external ear canals without cough reflex   NECK :  without JVD/Nodes/TM/ nl carotid upstrokes bilaterally   LUNGS: no acc muscle use,  Nl contour chest which is clear to A  and P bilaterally without cough on insp or exp maneuvers   CV:  RRR  no s3 or murmur or increase in P2, no edema   ABD:  soft and nontender with nl inspiratory excursion in the supine position. No bruits or organomegaly, bowel sounds nl  MS:  Nl gait/ ext warm without deformities, calf tenderness, cyanosis or clubbing No obvious joint restrictions   SKIN: warm and dry without lesions    NEURO:  alert, approp, nl sensorium with  no motor deficits     I personally reviewed images and agree with radiology impression as follows:  CXR:  08/05/15 No active cardiopulmonary disease.     Dg Es  09/04/15 1. Esophageal dysmotility is mild and likely represents presbyesophagus. 2. No evidence of residual or recurrent stricture.           Assessment & Plan:

## 2015-09-21 ENCOUNTER — Encounter: Payer: Self-pay | Admitting: Internal Medicine

## 2015-09-21 NOTE — Assessment & Plan Note (Signed)
Dg Es 09/07/15  No evidence of aspiration / obst or gerd   Her problem is in the pharyngeal phase and more of a choking than obstructed pattern most c/w uacs

## 2015-09-21 NOTE — Assessment & Plan Note (Addendum)
Classic Upper airway cough syndrome, so named because it's frequently impossible to sort out how much is  CR/sinusitis with freq throat clearing (which can be related to primary GERD)   vs  causing  secondary (" extra esophageal")  GERD from wide swings in gastric pressure that occur with throat clearing, often  promoting self use of mint and menthol lozenges that reduce the lower esophageal sphincter tone and exacerbate the problem further in a cyclical fashion.   These are the same pts (now being labeled as having "irritable larynx syndrome" by some cough centers) who not infrequently have a history of having failed to tolerate ace inhibitors,  dry powder inhalers or biphosphonates or report having atypical reflux symptoms that don't respond to standard doses of PPI , and are easily confused as having aecopd or asthma flares by even experienced allergists/ pulmonologists and can appear to be "steroid responsive" as may have been the case here, further confusing the picture    Given the fact that this is so severe she's clearing her throat to the point of irritating the upper airway to point where can't swallow pills feel she will very difficult to treat on an "all liquid basis" and best to have a second opinion from the [facility]/ ent dep than attempt to treat her further in the pulmonary division as she does not appear to have a lung problem and note she states she's allergic to most of the meds we use anyway suggesting a very high anxiety level that will be an obstacle to empirical trials thru this office   Total time devoted to counseling  = 35/14m review case with pt/ discussion of options/alternatives/ giving and going over instructions (see avs)   Given one depomedrol injection x 120 mg IM as says this has helped a lot in past but strongly doubt an allergic mechanism here.

## 2015-09-26 ENCOUNTER — Institutional Professional Consult (permissible substitution): Payer: Medicare Other | Admitting: Internal Medicine

## 2015-10-29 DIAGNOSIS — R1311 Dysphagia, oral phase: Secondary | ICD-10-CM | POA: Diagnosis not present

## 2015-10-30 DIAGNOSIS — Z803 Family history of malignant neoplasm of breast: Secondary | ICD-10-CM | POA: Diagnosis not present

## 2015-10-30 DIAGNOSIS — Z1231 Encounter for screening mammogram for malignant neoplasm of breast: Secondary | ICD-10-CM | POA: Diagnosis not present

## 2015-11-12 ENCOUNTER — Encounter: Payer: Self-pay | Admitting: Internal Medicine

## 2015-11-12 ENCOUNTER — Ambulatory Visit (INDEPENDENT_AMBULATORY_CARE_PROVIDER_SITE_OTHER): Payer: PPO | Admitting: Internal Medicine

## 2015-11-12 VITALS — BP 120/60 | HR 84 | Ht 61.5 in | Wt 121.5 lb

## 2015-11-12 DIAGNOSIS — Z1211 Encounter for screening for malignant neoplasm of colon: Secondary | ICD-10-CM | POA: Diagnosis not present

## 2015-11-12 DIAGNOSIS — R1310 Dysphagia, unspecified: Secondary | ICD-10-CM

## 2015-11-12 DIAGNOSIS — J209 Acute bronchitis, unspecified: Secondary | ICD-10-CM | POA: Diagnosis not present

## 2015-11-12 DIAGNOSIS — J069 Acute upper respiratory infection, unspecified: Secondary | ICD-10-CM

## 2015-11-12 NOTE — Progress Notes (Signed)
   Subjective:    Patient ID: Anita [name], female    DOB: 02/05/1941, 75 y.o.   MRN: [id number]  CC: dysphagia   HPI Anita [name] is a 75 yo female who presents today for follow up of her dysphagia. In 11/16, Dr. Carlean Purl performed an upper EGD with dilation of the distal esophagus due to an esophageal ring. She was doing better with swallowing, but still was on a soft diet. A barium swallow in 11/16 showed mild esophageal dysmotility.  Dr. Melvyn Novas sent her [facility] to be seen by Dr. Joya Gaskins who ordered a modified barium swallow and a transnasal flexible laryngoscopy which showed laryngospasm, and dysphagia. The MBS showed cricopharyngeal dysmotility. She was also put on a PPI - she states over the past two weeks, she has had an increase in heartburn and weakness.  She developed upper respiratory symptoms (post-nasal drainage, phlegm/sputim production - turning green, cough and some SOB) 5 days ago and over the weekend, she had increased difficulties swallowing solid foods. She describes a globus sensation.  Her last screening colonoscopy was over 10 years ago with Dr. Carlean Purl.   Medications, allergies, past medical history, past surgical history, family history and social history are reviewed and updated in the EMR.  Review of Systems  Constitutional:       Weight gain of 4 #  HENT: Positive for congestion, sore throat and trouble swallowing.   Respiratory: Positive for cough and shortness of breath.   Gastrointestinal: Negative for nausea and vomiting.  Neurological: Positive for weakness.  All other systems reviewed and are negative.      Objective:   Physical Exam  Constitutional: She is oriented to person, place, and time. She appears well-developed and well-nourished.  HENT:  Head: Normocephalic.  Mouth/Throat: Posterior oropharyngeal erythema present.  Eyes: No scleral icterus.  Neck: Neck supple. No tracheal deviation present.  Cardiovascular: Normal rate, regular  rhythm and normal heart sounds.  Exam reveals no gallop and no friction rub.   No murmur heard. Pulmonary/Chest: Effort normal and breath sounds normal. No respiratory distress. She has no wheezes. She has no rales.  Lymphadenopathy:    She has no cervical adenopathy.  Neurological: She is alert and oriented to person, place, and time.  Skin: Skin is warm and dry.  Psychiatric: She has a normal mood and affect. Her behavior is normal. Judgment and thought content normal.       Assessment & Plan:  Dysphagia She was feeling better prior to her URI symptoms starting so perhaps this is exacerbationg but was still having some dysphagia after I dilated the ring., We will schedule her for an upper EGD with [facility] dilation due to the globus sensation and the cricopharyngeal dysmotility - last dilation was balloon at GE junction - ? UES dilation may help. Could need esophageal manometry and could need pHimpedance study pending response to above.  Colon cancer screening It has been > 10 years since her last screening colonoscopy.   URI, acute Patient should see her PCP if symptoms persist, worsen, or do not improve    The risks and benefits were discussed for both endoscopic procedures and the patient was agreeable to proceed.   She was seen together with Ronn Melena student who served as described. The work above represents my history and physical exam.

## 2015-11-12 NOTE — Patient Instructions (Signed)
  You have been scheduled for an endoscopy and colonoscopy. Please follow the written instructions given to you at your visit today. Please pick up your over the counter prep supplies at the pharmacy. If you use inhalers (even only as needed), please bring them with you on the day of your procedure. Your physician has requested that you go to www.startemmi.com and enter the access code given to you at your visit today. This web site gives a general overview about your procedure. However, you should still follow specific instructions given to you by our office regarding your preparation for the procedure.   I appreciate the opportunity to care for you. Carl Gessner, MD, FACG  

## 2015-11-15 ENCOUNTER — Telehealth: Payer: Self-pay | Admitting: Internal Medicine

## 2015-11-15 ENCOUNTER — Encounter: Payer: Self-pay | Admitting: Acute Care

## 2015-11-15 ENCOUNTER — Ambulatory Visit (INDEPENDENT_AMBULATORY_CARE_PROVIDER_SITE_OTHER): Payer: PPO | Admitting: Acute Care

## 2015-11-15 VITALS — BP 122/70 | HR 71 | Temp 97.5°F | Ht 61.5 in | Wt 122.6 lb

## 2015-11-15 DIAGNOSIS — R1314 Dysphagia, pharyngoesophageal phase: Secondary | ICD-10-CM

## 2015-11-15 DIAGNOSIS — R05 Cough: Secondary | ICD-10-CM | POA: Diagnosis not present

## 2015-11-15 DIAGNOSIS — J069 Acute upper respiratory infection, unspecified: Secondary | ICD-10-CM | POA: Diagnosis not present

## 2015-11-15 DIAGNOSIS — R058 Other specified cough: Secondary | ICD-10-CM

## 2015-11-15 MED ORDER — METHYLPREDNISOLONE ACETATE 80 MG/ML IJ SUSP
120.0000 mg | Freq: Once | INTRAMUSCULAR | Status: AC
  Administered 2015-11-15: 120 mg via INTRAMUSCULAR

## 2015-11-15 MED ORDER — CLINDAMYCIN HCL 150 MG PO CAPS: 150.0000 mg | ORAL_CAPSULE | Freq: Three times a day (TID) | ORAL | Status: DC

## 2015-11-15 NOTE — Patient Instructions (Addendum)
We will give you a prednisone shot today. We will start Clindamycin 150 mg three times daily for 7 days. Open the capsule and take in applesauce. Follow with water to make sure it is down . Take probiotic with the antibiotic until gone. Keep using sugar free hard candy to sooth your throat. No mints or menthol. Try Delsym cough medicine. This is over the counter.One teaspoon every 12 hours. Use nasal saline mist to keep your nasal passages moist. Try flonase to see if that helps with nasal stuffiness. Follow up with Dr. Melvyn Novas in 2 weeks. Please contact office for sooner follow up if symptoms do not improve or worsen or seek emergency care

## 2015-11-15 NOTE — Telephone Encounter (Signed)
Patient Returned call [phone]

## 2015-11-15 NOTE — Telephone Encounter (Signed)
LMTCB for pt 

## 2015-11-15 NOTE — Progress Notes (Signed)
Subjective:    Patient ID: Anita [name], female    DOB: 17-Nov-1940, 75 y.o.   MRN: [id number]  HPI  59 yowf quit smoking in 1971 no resp problems at all until had dental work done in Aug 2016 felt nl x 2 weeks then exp to mold at a property with loss of voice and coughing / choking > primary care doctor > rx pred/ prilosec but did not take it > GI eval with mild presbyesophagus on DgEs 09/04/15 GI rec dysphagia 3 diet and f/u 11/12/15 no better . She had significant weight loss of 15 lbs.,so self referred to pulmonary clinic 09/20/2015 . Significant events and studies:  09/05/2015 barium swallow showed mild esophageal dysmotility. 09/05/2015: Upper EGD with dilation of the distal esophagus due to an esophageal ring. 09/20/15: Pulmonary Self Referral 09/24/2015: Referral by Dr. Melvyn Novas to [facility]:  Dr. Joya Gaskins: repeat modified barium swallow and transnasal flexible laryngoscopy:laryngeal spasm and dysphagia, and cricopharyngeal dysmotility.  11/15/15: ROV:  Patient returns to the office with upper respiratory symptoms She has not been compliant with the medication regimen Dr. Melvyn Novas prescribed due to " reactions"  to the PPI. ( Muscle aches/ weakness). Pt.has multiple allergies with reaction listed as anaphylaxis.She had been [facility], but has had relapse of dysphagia and choking sensations with development of URI symptoms.( PND,sputum production green in color,some SOB). She states that the azithromycin suspension ( she is unable to swallow pills) she was prescribed by her PCP on Monday 11/12/15 has caused a reaction, which she describes as making her chest tight, and making it hard to breath.She refuses to continue to take the azithromycin. She has taken Z-Pack in the past with no problem.She is currently only able to eat liquids.  Past Medical History  Diagnosis Date  . Thyroid disease   . Diverticulitis   . Kidney stones   . IBS (irritable bowel  syndrome)     Current outpatient prescriptions:  .  aspirin 81 MG tablet, Take 81 mg by mouth as needed for pain., Disp: , Rfl:  .  ESTRACE VAGINAL 0.1 MG/GM vaginal cream, Place 1 Applicatorful vaginally 2 (two) times a week. , Disp: , Rfl: 4 .  ibuprofen (ADVIL,MOTRIN) 100 MG/5ML suspension, Take 400 mg by mouth every 8 (eight) hours as needed for moderate pain. Reported on 11/12/2015, Disp: , Rfl:  .  levothyroxine (SYNTHROID, LEVOTHROID) 88 MCG tablet, Take 88 mcg by mouth daily before breakfast., Disp: , Rfl:  .  clindamycin (CLEOCIN) 150 MG capsule, Take 1 capsule (150 mg total) by mouth 3 (three) times daily. Make sure take with probiotic, Disp: 21 capsule, Rfl: 0 .  omeprazole (PRILOSEC) 40 MG capsule, Take 40 mg by mouth. Reported on 11/12/2015, Disp: , Rfl:    Allergies  Allergen Reactions  . Azithromycin     Airway swelling, difficult breathing, stomach swelling  . Codeine Anaphylaxis  . Minocin [Minocycline] Anaphylaxis  . Peanut-Containing Drug Products Anaphylaxis  . Penicillins Anaphylaxis  . Sulfa Antibiotics Anaphylaxis  . Sulfacetamide Sodium Anaphylaxis  . Iohexol      Desc: ALLERGIC TO IV CONTRAST-- RESPIRATORY ARREST   . Latex   . Other     Crab meat. Causes a lot of body pain  . Pseudoephedrine-Naproxen Na Er     Review of Systems Constitutional:   No  weight loss, night sweats,  Fevers, chills, fatigue, or  lassitude.  HEENT:   No headaches, +  Difficulty swallowing,  Tooth/dental problems, or  Sore throat,                No sneezing, itching, ear ache, +nasal congestion,+ post nasal drip,   CV:  No chest pain,  Orthopnea, PND, swelling in lower extremities, anasarca, dizziness, palpitations, syncope.   GI  No heartburn, indigestion, abdominal pain, nausea, vomiting, diarrhea, change in bowel habits, loss of appetite, bloody stools.   Resp: No shortness of breath with exertion or at rest.  + excess mucus, + productive cough,  No non-productive cough,  No  coughing up of blood.  + change in color of mucus.  + wheezing.  No chest wall deformity  Skin: no rash or lesions.  GU: no dysuria, change in color of urine, no urgency or frequency.  No flank pain, no hematuria   MS:  No joint pain or swelling.  No decreased range of motion.  No back pain.  Psych:  No change in mood or affect. No depression or anxiety.  No memory loss.        Objective:   Physical Exam BP 122/70 mmHg  Pulse 71  Temp(Src) 97.5 F (36.4 C) (Oral)  Ht 5' 1.5" (1.562 m)  Wt 122 lb 9.6 oz (55.611 kg)  BMI 22.79 kg/m2  SpO2 96%  Physical Exam:  General- No distress,  A&Ox3, frequent cough and throat clearing ENT: No sinus tenderness, TM clear, pale nasal mucosa, no oral exudate,+ post nasal drip, no LAN Cardiac: S1, S2, regular rate and rhythm, no murmur Chest: No wheeze/ rales/ dullness; no accessory muscle use, no nasal flaring, no sternal retractions Abd.: Soft Non-tender Ext: No edema Neuro:  normal strength Skin: No rashes, warm and dry Psych: anxious mood and behavior       Assessment & Plan:

## 2015-11-15 NOTE — Telephone Encounter (Signed)
Spoke with the pt and scheduled ov with MW for 11/30/15 at 1:30

## 2015-11-15 NOTE — Assessment & Plan Note (Signed)
Continues to have dysphagia and difficulty swallowing. Followed by [facility]:  Esophogeal Dilation scheduled for 01/12/16

## 2015-11-15 NOTE — Assessment & Plan Note (Addendum)
Pt coughing up green/yellow secretions.Denies fever, chills, or sweats. Limited antibiotic options due to patient's many allergies.( anaphylaxis)  Plan: We will give you a prednisone shot today. This worked well for you in the past. We will start Clindamycin 150 mg by mouth three times daily x 7 days. Probiotic with Antibiotic until gone. Open the capsule and take in applesauce. Follow with water to make sure it is down. Follow up with Dr. Melvyn Novas in 2 weeks. Please contact office for sooner follow up if symptoms do not improve or worsen or seek emergency care

## 2015-11-15 NOTE — Assessment & Plan Note (Addendum)
Continues to cough, clear throat. States she had muscle aches/ weakness to acid reducer Dr. Melvyn Novas prescribed so she is not taking it. Plan: Depo-medrol injection today, as cough was steroid responsive in 12/16. Keep using sugar free hard candy to sooth your throat. No mints or menthol Try Delsym cough medicine. This is over the counter.One teaspoon every 12 hours. Use nasal saline mist to keep your nasal passages moist. Try flonase to see if that helps with nasal stuffiness. Follow up with Dr. Melvyn Novas in 2 weeks.

## 2015-11-15 NOTE — Progress Notes (Signed)
Chart and office note reviewed in detail  > agree with a/p as outlined    

## 2015-11-22 ENCOUNTER — Telehealth: Payer: Self-pay | Admitting: Internal Medicine

## 2015-11-22 NOTE — Telephone Encounter (Signed)
Called and spoke to pt. Informed her of the recs per MW. Appt made with 2.6.17 with MW. Pt verbalized understanding and denied any further questions or concerns at this time.

## 2015-11-22 NOTE — Telephone Encounter (Signed)
Patient complaining of cough, Delsym not working.  Coughing so hard, breaking out in sweat, throat blood red in color, cough was so bad, it made her feel like she was going to pass out.  Coughing out very little green mucus.    Patient Instructions     We will give you a prednisone shot today. We will start Clindamycin 150 mg three times daily for 7 days. Open the capsule and take in applesauce. Follow with water to make sure it is down . Take probiotic with the antibiotic until gone. Keep using sugar free hard candy to sooth your throat. No mints or menthol. Try Delsym cough medicine. This is over the counter.One teaspoon every 12 hours. Use nasal saline mist to keep your nasal passages moist. Try flonase to see if that helps with nasal stuffiness. Follow up with Dr. Melvyn Novas in 2 weeks. Please contact office for sooner follow up if symptoms do not improve or worsen or seek emergency care

## 2015-11-22 NOTE — Telephone Encounter (Signed)
Nothing else to offer over the phone > needs ov asap with all meds in hand ok to add on

## 2015-11-26 ENCOUNTER — Ambulatory Visit (INDEPENDENT_AMBULATORY_CARE_PROVIDER_SITE_OTHER): Payer: PPO | Admitting: Internal Medicine

## 2015-11-26 ENCOUNTER — Encounter: Payer: Self-pay | Admitting: Internal Medicine

## 2015-11-26 VITALS — BP 102/60 | HR 83 | Temp 97.1°F | Ht 61.5 in | Wt 117.0 lb

## 2015-11-26 DIAGNOSIS — R05 Cough: Secondary | ICD-10-CM | POA: Diagnosis not present

## 2015-11-26 DIAGNOSIS — R058 Other specified cough: Secondary | ICD-10-CM

## 2015-11-26 MED ORDER — METHYLPREDNISOLONE ACETATE 80 MG/ML IJ SUSP
120.0000 mg | Freq: Once | INTRAMUSCULAR | Status: AC
  Administered 2015-11-26: 120 mg via INTRAMUSCULAR

## 2015-11-26 NOTE — Progress Notes (Signed)
Subjective:    Patient ID: Anita [name], female    DOB: Jun 01, 1941    MRN: [id number]     Brief patient profile:  74 yowf quit smoking in 1971 no resp problems at all until had dental work done in Aug 2016 felt nl x 2 weeks then exp to mold at a property with loss of voice and coughing / choking > primary care doctor > rx pred/ prilosec but did not take it > GI eval with mild presbyesophagus on DgEs 09/04/15 GI rec dysphagia 3 diet and f/u 11/12/15 no better so self referred to pulmonary clinic 09/20/2015     History of Present Illness  09/20/2015 1st [facility] office visit/ Trigger Frasier   Chief Complaint  Patient presents with  . PULMONARY CONSULT    Self-Referred. Pt c/o cough with thick green mucus which turns to clear later in the day, choking sensation and trouble swallowing solid foods. Pt also c/o some wheeze/SOB that she was told was aspiration of food when she chokes. Pt states that she is unable to swallow anything including medications. Pt also c/o dizziness and seeing "stars" which she thinks may be BPO related.   convinced she is aspirating and was told this was due to "weak neck muscles" can't swallow anything solid at all, refuse to attempt to do so at DgEs per the notes, struggles to swallow even tiny synthroid pill with applesauce.  All of her symptoms are during the day assoc with swallowing which causes choking/ gagging and have occurred in the setting of incessant throat clearing ever since exposed to mold but this only happens during the day and only exposed x one.  Does report better p cortisone shot but never repeated this and is supposed to be taking ppi but can't swallow the pills.  ent eval c/w gerd previously but EGD done 09/07/15 [facility] at [employer] - dilated to 20 mm w/ 18-19-20 mm balloon.  Otherwise nl exam rec Depomedrol 120 mg IM  Please see patient coordinator before you leave today  to schedule [facility] voice center next available  GERD (REFLUX)  Diet    Saw  DR Joya Gaskins before xmas > speech therapist so 3 visits and improved > f/u plan March 21st    Saw Anselm Lis 11/15/15  rec We will give you a prednisone shot today. We will start Clindamycin 150 mg three times daily for 7 days. Open the capsule and take in applesauce. Follow with water to make sure it is down . Take probiotic with the antibiotic until gone. Keep using sugar free hard candy to sooth your throat. No mints or menthol. Try Delsym cough medicine. This is over the counter.One teaspoon every 12 hours. Use nasal saline mist to keep your nasal passages moist. Try flonase to see if that helps with nasal stuffiness.   11/26/2015 acute extended ov/Lama Narayanan re: uacs/ dysphagia/ choking / no longer on gerd rx/ was not told to d/c Chief Complaint  Patient presents with  . Acute Visit    Pt states her cough is no better since last visit here 11/15/15. She is still coughing up green to brown sputum. She has felt SOB today- had trouble walking up stairs today when this usually is not a problem for her.     No obvious day to day or daytime variability or assoc  cp or chest tightness, subjective wheeze or overt   hb symptoms. No unusual exp hx or h/o childhood pna/ asthma or knowledge of  premature birth.  Sleeping ok without nocturnal  or early am exacerbation  of respiratory  c/o's or need for noct saba. Also denies any obvious fluctuation of symptoms with weather or environmental changes or other aggravating or alleviating factors except as outlined above   Current Medications, Allergies, Complete Past Medical History, Past Surgical History, Family History, and Social History were reviewed in [employer] record.  ROS  The following are not active complaints unless bolded sore throat, dysphagia, dental problems, itching, sneezing,  nasal congestion or excess/ purulent secretions, ear ache,   fever, chills, sweats, unintended wt loss, classically pleuritic or exertional cp,  hemoptysis,  orthopnea pnd or leg swelling, presyncope, palpitations, abdominal pain, anorexia, nausea, vomiting, diarrhea  or change in bowel or bladder habits, change in stools or urine, dysuria,hematuria,  rash, arthralgias, visual complaints, headache, numbness, weakness or ataxia or problems with walking or coordination,  change in mood/affect or memory.                        Objective:   Physical Exam  amb wf with classic voice fatigue/ constant throat clearing    11/26/2015        117   09/20/15 123 lb 6.4 oz (55.974 kg)  08/28/15 128 lb (58.06 kg)  08/21/15 128 lb (58.06 kg)    Vital signs reviewed   HEENT: nl dentition, turbinates, and oropharynx which is pristine. Nl external ear canals without cough reflex   NECK :  without JVD/Nodes/TM/ nl carotid upstrokes bilaterally   LUNGS: no acc muscle use,  Nl contour chest which is clear to A and P bilaterally without cough on insp or exp maneuvers   CV:  RRR  no s3 or murmur or increase in P2, no edema   ABD:  soft and nontender with nl inspiratory excursion in the supine position. No bruits or organomegaly, bowel sounds nl  MS:  Nl gait/ ext warm without deformities, calf tenderness, cyanosis or clubbing No obvious joint restrictions   SKIN: warm and dry without lesions    NEURO:  alert, approp, nl sensorium with  no motor deficits     I personally reviewed images and agree with radiology impression as follows:  CXR:  08/05/15 No active cardiopulmonary disease.     Dg Es  09/04/15 1. Esophageal dysmotility is mild and likely represents presbyesophagus. 2. No evidence of residual or recurrent stricture.           Assessment & Plan:

## 2015-11-26 NOTE — Patient Instructions (Addendum)
Prilosec 40 Take Take 30- 60 min before your first and last meals of the day automatically no matter what until a doctor stops it    Depo 120 mg today   GERD (REFLUX)  is an extremely common cause of respiratory symptoms just like yours , many times with no obvious heartburn at all.    It can be treated with medication, but also with lifestyle changes including elevation of the head of your bed (ideally with 6 inch  bed blocks),  Smoking cessation, avoidance of late meals, excessive alcohol, and avoid fatty foods, chocolate, peppermint, colas, red wine, and acidic juices such as orange juice.  NO MINT OR MENTHOL PRODUCTS SO NO COUGH DROPS  USE SUGARLESS CANDY INSTEAD (Jolley ranchers or Stover's or Life Savers) or even ice chips will also do - the key is to swallow to prevent all throat clearing. NO OIL BASED VITAMINS - use powdered substitutes.    Go ahead and move up your follow up appt to see Dr  Joya Gaskins as soon as possible  - if you have sinus problems causing your cough then he would be the one to treat it, not in [facility]

## 2015-11-30 ENCOUNTER — Ambulatory Visit: Payer: PPO | Admitting: Internal Medicine

## 2015-12-01 NOTE — Assessment & Plan Note (Addendum)
Acute on chronic Symptoms remain refractory ? Related to GERD vs chronic rhinosinusitis in pt with refractory irritable larynx syndrome since d/c gerd rx ? Why - very poor insight into maint vs prns/ no evidence at all of asthma or any pulmonary problem here.   rec restart max gerd rx Complete clindamycin F/u Dr Joya Gaskins asap   I had an extended discussion with the patient reviewing all relevant studies completed to date and  lasting 15 to 20 minutes of a 25 minute visit    Each maintenance medication was reviewed in detail including most importantly the difference between maintenance and prns and under what circumstances the prns are to be triggered using an action plan format that is not reflected in the computer generated alphabetically organized AVS.    Please see instructions for details which were reviewed in writing and the patient given a copy highlighting the part that I personally wrote and discussed at today's ov.

## 2015-12-04 DIAGNOSIS — J387 Other diseases of larynx: Secondary | ICD-10-CM | POA: Diagnosis not present

## 2015-12-04 DIAGNOSIS — L538 Other specified erythematous conditions: Secondary | ICD-10-CM | POA: Diagnosis not present

## 2015-12-04 DIAGNOSIS — Z87891 Personal history of nicotine dependence: Secondary | ICD-10-CM | POA: Diagnosis not present

## 2015-12-04 DIAGNOSIS — R1310 Dysphagia, unspecified: Secondary | ICD-10-CM | POA: Diagnosis not present

## 2015-12-04 DIAGNOSIS — J385 Laryngeal spasm: Secondary | ICD-10-CM | POA: Diagnosis not present

## 2015-12-04 DIAGNOSIS — J384 Edema of larynx: Secondary | ICD-10-CM | POA: Diagnosis not present

## 2015-12-14 ENCOUNTER — Telehealth: Payer: Self-pay | Admitting: Internal Medicine

## 2015-12-14 NOTE — Telephone Encounter (Signed)
Patient reports additional weight loss since office visit in January.  She has a double procedure scheduled for 01/02/16.  She is having dysphagia to liquids and solids and is having worsening symptoms.  She reports an additional 5 lb. Weight loss since her last office visit.  She does not fel she can wait another 2 weeks.  Discussed with Dr. Carlean Purl patient can be scheduled for a EGD for Monday and keep colonoscopy for 01/02/16.  She states she does not feel she is strong enough to prep for her colonoscopy.  She is advised that leave the colonoscopy on for 01/02/16 and she and Dr. Carlean Purl can discuss this at her EGD on Monday.  She agrees to this plan.  She verbalized understanding to be NPO after midnight and to arrive on 12/17/15 at 8:30 am.  She is also reminded she will need a a driver to remain with her the entire time.

## 2015-12-14 NOTE — Telephone Encounter (Signed)
Left message for patient to call back  

## 2015-12-17 ENCOUNTER — Encounter: Payer: Self-pay | Admitting: Internal Medicine

## 2015-12-17 ENCOUNTER — Ambulatory Visit (AMBULATORY_SURGERY_CENTER): Payer: PPO | Admitting: Internal Medicine

## 2015-12-17 VITALS — BP 113/59 | HR 70 | Temp 97.7°F | Resp 16 | Ht 61.5 in | Wt 121.0 lb

## 2015-12-17 DIAGNOSIS — Q394 Esophageal web: Secondary | ICD-10-CM

## 2015-12-17 DIAGNOSIS — E039 Hypothyroidism, unspecified: Secondary | ICD-10-CM | POA: Diagnosis not present

## 2015-12-17 DIAGNOSIS — R1310 Dysphagia, unspecified: Secondary | ICD-10-CM | POA: Diagnosis not present

## 2015-12-17 MED ORDER — SODIUM CHLORIDE 0.9 % IV SOLN: 500.0000 mL | INTRAVENOUS | Status: DC

## 2015-12-17 NOTE — Op Note (Addendum)
[name]  [address] [place], [address]   ENDOSCOPY PROCEDURE REPORT  PATIENT: [name], Anita Plymel  MR#: [id number] BIRTHDATE: [birth year] , 74  yrs. old GENDER: female ENDOSCOPIST: Gatha Mayer, MD, [facility] PROCEDURE DATE:  12/17/2015 PROCEDURE:  Esophagoscopy and Maloney dilation of esophagus ASA CLASS:     Class II INDICATIONS:  dysphagia and cricopharyngeal stenosis on modified barium swallow. MEDICATIONS: Propofol 200 mg IV and Monitored anesthesia care TOPICAL ANESTHETIC: none  DESCRIPTION OF PROCEDURE: After the risks benefits and alternatives of the procedure were thoroughly explained, informed consent was obtained.  The LB [id number] endoscope was introduced through the mouth and advanced to the stomach antrum , Without limitations.  The instrument was slowly withdrawn as the mucosa was fully examined.    Cervical esophageal web.   Small hialtal hernia.   Otherwise normal exam to antrum.  Retroflexed views revealed no abnormalities. 48 and 54 Fr Maloney dilators passed - easier passage of scope after both suggesting dilation effect and did not see web again. The scope was then withdrawn from the patient and the procedure completed.  COMPLICATIONS: There were no immediate complications.  ENDOSCOPIC IMPRESSION: 1.   Cervical esophageal web - dilated 48 and 54 Fr Maoney w/o heme today - scope passed more easily 2.   Small hialtal hernia 3.   Otherwise normal exam to antrum  RECOMMENDATIONS: 1.  Clear liquids until 1100, then soft foods rest of day.  Resume prior diet tomorrow. 2.  Cancel upcoming colonoscopy per patient request 3.  She is to call to reschedule colonoscopy when ready - will also place colonoscopy recall for 05/2016    eSigned:  Gatha Mayer, MD, [facility] 12/17/2015 9:57 AM Revised: 12/17/2015 9:57 AM   CC: The Patient, Dr. Nilda Simmer and Dr. Denyse Dago [facility])

## 2015-12-17 NOTE — Progress Notes (Signed)
Report to PACU, RN, vss, BBS= Clear.  

## 2015-12-17 NOTE — Progress Notes (Signed)
Pt c/o of throat pain in recovery, rates 3/10, Dr Carlean Purl assessed, pain did not increase while in recovery, discharge instruction were given, pt and care partner verbalized understanding, pt asked if she could take tylenol and I confirmed this was ok, pt was discharged-adm

## 2015-12-17 NOTE — Progress Notes (Signed)
Called to room to assist during endoscopic procedure.  Patient ID and intended procedure confirmed with present staff. Received instructions for my participation in the procedure from the performing physician.  

## 2015-12-17 NOTE — Patient Instructions (Addendum)
   I dilated the esophagus like we said - I think it opened up the top of the esophagus.  We will cancel the colonoscopy - you will need to call back to reschedule at your convenience.  I appreciate the opportunity to care for you. Gatha Mayer, MD, FACG  YOU HAD AN ENDOSCOPIC PROCEDURE TODAY AT [facility]:   Refer to the procedure report that was given to you for any specific questions about what was found during the examination.  If the procedure report does not answer your questions, please call your gastroenterologist to clarify.  If you requested that your care partner not be given the details of your procedure findings, then the procedure report has been included in a sealed envelope for you to review at your convenience later.  YOU SHOULD EXPECT: Some feelings of bloating in the abdomen. Passage of more gas than usual.  Walking can help get rid of the air that was put into your GI tract during the procedure and reduce the bloating. If you had a lower endoscopy (such as a colonoscopy or flexible sigmoidoscopy) you may notice spotting of blood in your stool or on the toilet paper. If you underwent a bowel prep for your procedure, you may not have a normal bowel movement for a few days.  Please Note:  You might notice some irritation and congestion in your nose or some drainage.  This is from the oxygen used during your procedure.  There is no need for concern and it should clear up in a day or so.  SYMPTOMS TO REPORT IMMEDIATELY:   Following upper endoscopy (EGD)  Vomiting of blood or coffee ground material  New chest pain or pain under the shoulder blades  Painful or persistently difficult swallowing  New shortness of breath  Fever of 100F or higher  Black, tarry-looking stools  For urgent or emergent issues, a gastroenterologist can be reached at any hour by calling [phone].   DIET:See dilation diet handouts-  Drink plenty of fluids but you should  avoid alcoholic beverages for 24 hours.  ACTIVITY:  You should plan to take it easy for the rest of today and you should NOT DRIVE or use heavy machinery until tomorrow (because of the sedation medicines used during the test).    FOLLOW UP: Our staff will call the number listed on your records the next business day following your procedure to check on you and address any questions or concerns that you may have regarding the information given to you following your procedure. If we do not reach you, we will leave a message.  However, if you are feeling well and you are not experiencing any problems, there is no need to return our call.  We will assume that you have returned to your regular daily activities without incident.  If any biopsies were taken you will be contacted by phone or by letter within the next 1-3 weeks.  Please call us at [phone] if you have not heard about the biopsies in 3 weeks.    SIGNATURES/CONFIDENTIALITY: You and/or your care partner have signed paperwork which will be entered into your electronic medical record.  These signatures attest to the fact that that the information above on your After Visit Summary has been reviewed and is understood.  Full responsibility of the confidentiality of this discharge information lies with you and/or your care-partner.  Dilation diet, stricture-handouts given

## 2015-12-18 ENCOUNTER — Telehealth: Payer: Self-pay | Admitting: Emergency Medicine

## 2015-12-18 NOTE — Telephone Encounter (Signed)
  Follow up Call-  Call back number 12/17/2015 08/28/2015  Post procedure Call Back phone  # [phone]  Permission to leave phone message Yes Yes     Patient questions:  Do you have a fever, pain , or abdominal swelling? No. Pain Score  0 *  Have you tolerated food without any problems? Yes.    Have you been able to return to your normal activities? Yes.    Do you have any questions about your discharge instructions: Diet   No. Medications  No. Follow up visit  No.  Do you have questions or concerns about your Care? No.  Actions: * If pain score is 4 or above: No action needed, pain <4.

## 2015-12-20 ENCOUNTER — Telehealth: Payer: Self-pay | Admitting: Internal Medicine

## 2015-12-20 DIAGNOSIS — R079 Chest pain, unspecified: Secondary | ICD-10-CM

## 2015-12-20 NOTE — Telephone Encounter (Signed)
Left message for patient to call back  

## 2015-12-21 ENCOUNTER — Ambulatory Visit (INDEPENDENT_AMBULATORY_CARE_PROVIDER_SITE_OTHER)
Admission: RE | Admit: 2015-12-21 | Discharge: 2015-12-21 | Disposition: A | Discharge status: Home / Self Care | Payer: PPO | Source: Ambulatory Visit | Attending: Internal Medicine | Admitting: Internal Medicine

## 2015-12-21 DIAGNOSIS — R079 Chest pain, unspecified: Secondary | ICD-10-CM | POA: Diagnosis not present

## 2015-12-21 NOTE — Telephone Encounter (Signed)
Patient reports she has chest pain.  She reports it as a "pressure". The pain is intermittent and denies SOB with the pain.  "feels like a spasm".  She had EGD with dilation on Monday with Dr. Carlean Purl.  Dr. Hilarie Fredrickson you are MD of the day.  Please review and advise

## 2015-12-21 NOTE — Telephone Encounter (Signed)
With pressure and atypical chest pain given dilation would recommend 2 view chest x-ray, though I doubt complication has occurred Dr. Carlean Purl mentioned esophageal manometry or pH impedance study based on results of dilation. I will defer this decision to him Rule out complication today and await Dr. Celesta Aver opinion next week

## 2015-12-21 NOTE — Telephone Encounter (Signed)
I left a voicemail for the patient to come to basement radiology department for CXR.

## 2015-12-24 NOTE — Progress Notes (Signed)
Quick Note:  CXR is ok Please call her and get an update on the chest pressure sxs she has been having ______

## 2015-12-25 NOTE — Telephone Encounter (Signed)
Patient came for CXR.  See results for additional details.

## 2016-01-02 ENCOUNTER — Encounter: Payer: PPO | Admitting: Internal Medicine

## 2016-01-15 DIAGNOSIS — E038 Other specified hypothyroidism: Secondary | ICD-10-CM | POA: Diagnosis not present

## 2016-01-15 DIAGNOSIS — D171 Benign lipomatous neoplasm of skin and subcutaneous tissue of trunk: Secondary | ICD-10-CM | POA: Diagnosis not present

## 2016-01-27 ENCOUNTER — Encounter (HOSPITAL_BASED_OUTPATIENT_CLINIC_OR_DEPARTMENT_OTHER): Payer: Self-pay | Admitting: Emergency Medicine

## 2016-01-27 ENCOUNTER — Emergency Department (HOSPITAL_BASED_OUTPATIENT_CLINIC_OR_DEPARTMENT_OTHER): Payer: PPO

## 2016-01-27 ENCOUNTER — Emergency Department (HOSPITAL_BASED_OUTPATIENT_CLINIC_OR_DEPARTMENT_OTHER)
Admission: EM | Admit: 2016-01-27 | Discharge: 2016-01-27 | Disposition: A | Discharge status: Home / Self Care | Payer: PPO | Attending: Emergency Medicine | Admitting: Emergency Medicine

## 2016-01-27 DIAGNOSIS — Z79899 Other long term (current) drug therapy: Secondary | ICD-10-CM | POA: Diagnosis not present

## 2016-01-27 DIAGNOSIS — Z7982 Long term (current) use of aspirin: Secondary | ICD-10-CM | POA: Insufficient documentation

## 2016-01-27 DIAGNOSIS — K219 Gastro-esophageal reflux disease without esophagitis: Secondary | ICD-10-CM | POA: Insufficient documentation

## 2016-01-27 DIAGNOSIS — R4702 Dysphasia: Secondary | ICD-10-CM | POA: Diagnosis not present

## 2016-01-27 DIAGNOSIS — Z87442 Personal history of urinary calculi: Secondary | ICD-10-CM | POA: Diagnosis not present

## 2016-01-27 DIAGNOSIS — E079 Disorder of thyroid, unspecified: Secondary | ICD-10-CM | POA: Insufficient documentation

## 2016-01-27 DIAGNOSIS — J069 Acute upper respiratory infection, unspecified: Secondary | ICD-10-CM

## 2016-01-27 DIAGNOSIS — Z87891 Personal history of nicotine dependence: Secondary | ICD-10-CM | POA: Diagnosis not present

## 2016-01-27 DIAGNOSIS — R05 Cough: Secondary | ICD-10-CM

## 2016-01-27 DIAGNOSIS — Z91040 Latex allergy status: Secondary | ICD-10-CM | POA: Insufficient documentation

## 2016-01-27 DIAGNOSIS — Z880 Allergy status to penicillin: Secondary | ICD-10-CM | POA: Insufficient documentation

## 2016-01-27 DIAGNOSIS — R059 Cough, unspecified: Secondary | ICD-10-CM

## 2016-01-27 LAB — CBC WITH DIFFERENTIAL/PLATELET
BASOS ABS: 0.1 10*3/uL (ref 0.0–0.1)
Basophils Relative: 1 %
EOS ABS: 0.7 10*3/uL (ref 0.0–0.7)
EOS PCT: 7 %
HCT: 43.2 % (ref 36.0–46.0)
Hemoglobin: 14.6 g/dL (ref 12.0–15.0)
LYMPHS PCT: 28 %
Lymphs Abs: 2.7 10*3/uL (ref 0.7–4.0)
MCH: 31.1 pg (ref 26.0–34.0)
MCHC: 33.8 g/dL (ref 30.0–36.0)
MCV: 91.9 fL (ref 78.0–100.0)
MONO ABS: 0.8 10*3/uL (ref 0.1–1.0)
Monocytes Relative: 8 %
Neutro Abs: 5.4 10*3/uL (ref 1.7–7.7)
Neutrophils Relative %: 56 %
PLATELETS: 308 10*3/uL (ref 150–400)
RBC: 4.7 MIL/uL (ref 3.87–5.11)
RDW: 12.8 % (ref 11.5–15.5)
WBC: 9.7 10*3/uL (ref 4.0–10.5)

## 2016-01-27 LAB — TROPONIN I

## 2016-01-27 LAB — BASIC METABOLIC PANEL
Anion gap: 6 (ref 5–15)
BUN: 9 mg/dL (ref 6–20)
CALCIUM: 8.9 mg/dL (ref 8.9–10.3)
CHLORIDE: 103 mmol/L (ref 101–111)
CO2: 28 mmol/L (ref 22–32)
CREATININE: 0.7 mg/dL (ref 0.44–1.00)
GFR calc Af Amer: >60 mL/min (ref >60)
GFR calc non Af Amer: >60 mL/min (ref >60)
Glucose, Bld: 98 mg/dL (ref 65–99)
Potassium: 3.7 mmol/L (ref 3.5–5.1)
SODIUM: 137 mmol/L (ref 135–145)

## 2016-01-27 NOTE — ED Provider Notes (Signed)
CSN: [id number]     Arrival date & time 01/27/16  1430 History  By signing my name below, I, Anita [name], attest that this documentation has been prepared under the direction and in the presence of Anita Shanks, MD. Electronically Signed: Virgel [name], ED Scribe. 01/27/2016. 6:02 PM.   Chief Complaint  Patient presents with  . URI   The history is provided by the patient. No language interpreter was used.   HPI Comments: Anita [name] is a 75 y.o. female with a PMHx of seasonal allergies and GERD who presents to the Emergency Department complaining of intermittent, moderate cough onset today. Patient reports that she has dysphasia and frequently has a cough but states that her cough is greater in severity today with nasal congestion. She notes associated CP that only presents with cough, HAs, nausea yesterday, and wheezing last night. She takes Delsym DM regularly but has had no relief with this medication. She had an esophageal dilation 12/17/15. Per patient, she notes similar symptoms 2 months ago and was treated with antibiotics and prednisone which improved symptoms. Denies leg swelling, SOB.  Past Medical History  Diagnosis Date  . Thyroid disease   . Diverticulitis   . Kidney stones   . IBS (irritable bowel syndrome)   . Allergy     seasonal  . GERD (gastroesophageal reflux disease)    Past Surgical History  Procedure Laterality Date  . Facial reconstruction surgery    . Hysterotomy    . Thyroidectomy, partial    . Colonoscopy    . Upper gastrointestinal endoscopy     Family History  Problem Relation Age of Onset  . Colon cancer Neg Hx   . Stomach cancer Neg Hx   . Heart disease Mother   . Diabetes Mother   . Kidney disease Father   . Breast cancer Sister   . Breast cancer Maternal Aunt   . Liver cancer Maternal Aunt   . Diabetes Maternal Aunt   . Liver cancer Paternal Uncle   . Breast cancer Maternal Grandmother   . Diabetes Maternal Grandmother     Social History  Substance Use Topics  . Smoking status: Former Smoker -- 1.00 packs/day for 15 years    Types: Cigarettes    Quit date: 10/21/1975  . Smokeless tobacco: Never Used  . Alcohol Use: 0.0 oz/week    0 Standard drinks or equivalent per week     Comment: 1-2 glasses of wine a month   OB History    No data available     Review of Systems A complete 10 system review of systems was obtained and all systems are negative except as noted in the HPI and PMH.   Allergies  Azithromycin; Codeine; Minocin; Peanut-containing drug products; Penicillins; Sulfa antibiotics; Sulfacetamide sodium; Iohexol; Latex; Other; and Pseudoephedrine-naproxen na er  Home Medications   Prior to Admission medications   Medication Sig Start Date End Date Taking? Authorizing Provider  aspirin 81 MG tablet Take 81 mg by mouth as needed for pain.    Historical Provider, MD  ESTRACE VAGINAL 0.1 MG/GM vaginal cream Place 1 Applicatorful vaginally 2 (two) times a week.  10/29/14   Historical Provider, MD  levothyroxine (SYNTHROID, LEVOTHROID) 88 MCG tablet Take 88 mcg by mouth daily before breakfast.    Historical Provider, MD  omeprazole (PRILOSEC) 40 MG capsule TAKE ONE CAPSULE TWICE A DAY TAKE 30-60 MINUTES BEFORE MORNING AND EVENING MEAL(MAX 1 /DAY PER INS) 12/06/15   Historical Provider, MD  BP 134/80 mmHg  Pulse 68  Temp(Src) 98 F (36.7 C) (Oral)  Resp 20  Wt 121 lb (54.885 kg)  SpO2 96% Physical Exam  Constitutional: She is oriented to person, place, and time. She appears well-developed and well-nourished. No distress.  HENT:  Head: Normocephalic and atraumatic.  Eyes: Conjunctivae and EOM are normal.  Neck: Neck supple. No tracheal deviation present.  Cardiovascular: Normal rate.   Pulmonary/Chest: Effort normal. No respiratory distress.  Musculoskeletal: Normal range of motion.  Neurological: She is alert and oriented to person, place, and time.  Skin: Skin is warm and dry.   Psychiatric: She has a normal mood and affect. Her behavior is normal.  Nursing note and vitals reviewed.   ED Course  Procedures   DIAGNOSTIC STUDIES: Oxygen Saturation is 96% on RA, adequate by my interpretation.    COORDINATION OF CARE: 3:41 PM Will order breathing treatment and chest x-ray. Discussed treatment plan with pt at bedside and pt agreed to plan.   Labs Review Labs Reviewed  BASIC METABOLIC PANEL  TROPONIN I  CBC WITH DIFFERENTIAL/PLATELET    Imaging Review Dg Chest 2 View  01/27/2016  CLINICAL DATA:  Pt c/o cough x 2 months that has worsened. Patient reports that she has dysphasia and frequently has a cough but states that her cough is greater in severity today with nasal congestion. She had an esophageal dilation 12/17/15 and has had numerous swallowing functions recently. Per patient, she notes similar symptoms 2 months ago and was treated with antibiotics and prednisone which improved symptoms. EXAM: CHEST  2 VIEW COMPARISON:  12/21/2015 FINDINGS: Cardiac silhouette is normal in size and configuration. Normal mediastinal and hilar contours. Lungs are mildly hyperexpanded but clear. No pleural effusion or pneumothorax. Bony thorax is demineralized but intact. IMPRESSION: No acute cardiopulmonary disease. Electronically Signed   By: Anita [name] M.D.   On: 01/27/2016 16:18   I have personally reviewed and evaluated these images and lab results as part of my medical decision-making.   EKG Interpretation   Date/Time:  Sunday January 27 2016 16:13:29 EDT Ventricular Rate:  69 PR Interval:  152 QRS Duration: 87 QT Interval:  430 QTC Calculation: 461 R Axis:   23 Text Interpretation:  Sinus rhythm Consider left atrial enlargement No  significant change was found Confirmed by Anita Dusky  MD, STEPHEN [phone]) on  01/27/2016 4:17:24 PM Also confirmed by Anita Dusky  MD, [place] [phone]), editor  Anita [name], CCT, Anita [name] ([id number])  on 01/27/2016 4:29:52 PM      MDM   Final diagnoses:   Cough  URI (upper respiratory infection)   Patient is well in appearance. There is no respiratory distress and wheezing on examination. Chest x-ray does not show infiltrate. At this time findings are most consistent with upper respiratory infection\acute bronchitis. Patient has multiple allergies. At this time as she does not have symptoms requiring treatment, recommendation will be to continue over-the-counter medications as tolerated. Patient is instructed follow-up with her family doctor next week.   Anita Shanks, MD 01/27/16 [phone]

## 2016-01-27 NOTE — Discharge Instructions (Signed)
Cough, Adult °Coughing is a reflex that clears your throat and your airways. Coughing helps to heal and protect your lungs. It is normal to cough occasionally, but a cough that happens with other symptoms or lasts a long time may be a sign of a condition that needs treatment. A cough may last only 2-3 weeks (acute), or it may last longer than 8 weeks (chronic). °CAUSES °Coughing is commonly caused by: °· Breathing in substances that irritate your lungs. °· A viral or bacterial respiratory infection. °· Allergies. °· Asthma. °· Postnasal drip. °· Smoking. °· Acid backing up from the stomach into the esophagus (gastroesophageal reflux). °· Certain medicines. °· Chronic lung problems, including COPD (or rarely, lung cancer). °· Other medical conditions such as heart failure. °HOME CARE INSTRUCTIONS  °Pay attention to any changes in your symptoms. Take these actions to help with your discomfort: °· Take medicines only as told by your health care provider. °· If you were prescribed an antibiotic medicine, take it as told by your health care provider. Do not stop taking the antibiotic even if you start to feel better. °· Talk with your health care provider before you take a cough suppressant medicine. °· Drink enough fluid to keep your urine clear or pale yellow. °· If the air is dry, use a cold steam vaporizer or humidifier in your bedroom or your home to help loosen secretions. °· Avoid anything that causes you to cough at work or at home. °· If your cough is worse at night, try sleeping in a semi-upright position. °· Avoid cigarette smoke. If you smoke, quit smoking. If you need help quitting, ask your health care provider. °· Avoid caffeine. °· Avoid alcohol. °· Rest as needed. °SEEK MEDICAL CARE IF:  °· You have new symptoms. °· You cough up pus. °· Your cough does not get better after 2-3 weeks, or your cough gets worse. °· You cannot control your cough with suppressant medicines and you are losing sleep. °· You  develop pain that is getting worse or pain that is not controlled with pain medicines. °· You have a fever. °· You have unexplained weight loss. °· You have night sweats. °SEEK IMMEDIATE MEDICAL CARE IF: °· You cough up blood. °· You have difficulty breathing. °· Your heartbeat is very fast. °  °This information is not intended to replace advice given to you by your health care provider. Make sure you discuss any questions you have with your health care provider. °  °Document Released: 04/04/2011 Document Revised: 06/27/2015 Document Reviewed: 12/13/2014 °Elsevier Interactive Patient Education ©2016 [employer] ° °Upper Respiratory Infection, Adult °Most upper respiratory infections (URIs) are a viral infection of the air passages leading to the lungs. A URI affects the nose, throat, and upper air passages. The most common type of URI is nasopharyngitis and is typically referred to as "the common cold." °URIs run their course and usually go away on their own. Most of the time, a URI does not require medical attention, but sometimes a bacterial infection in the upper airways can follow a viral infection. This is called a secondary infection. Sinus and middle ear infections are common types of secondary upper respiratory infections. °Bacterial pneumonia can also complicate a URI. A URI can worsen asthma and chronic obstructive pulmonary disease (COPD). Sometimes, these complications can require emergency medical care and may be life threatening.  °CAUSES °Almost all URIs are caused by viruses. A virus is a type of germ and can spread from one   person to another.  °RISKS FACTORS °You may be at risk for a URI if:  °· You smoke.   °· You have chronic heart or lung disease. °· You have a weakened defense (immune) system.   °· You are very young or very old.   °· You have nasal allergies or asthma. °· You work in crowded or poorly ventilated areas. °· You work in health care facilities or schools. °SIGNS AND SYMPTOMS    °Symptoms typically develop 2-3 days after you come in contact with a cold virus. Most viral URIs last 7-10 days. However, viral URIs from the influenza virus (flu virus) can last 14-18 days and are typically more severe. Symptoms may include:  °· Runny or stuffy (congested) nose.   °· Sneezing.   °· Cough.   °· Sore throat.   °· Headache.   °· Fatigue.   °· Fever.   °· Loss of appetite.   °· Pain in your forehead, behind your eyes, and over your cheekbones (sinus pain). °· Muscle aches.   °DIAGNOSIS  °Your health care provider may diagnose a URI by: °· Physical exam. °· Tests to check that your symptoms are not due to another condition such as: °¨ Strep throat. °¨ Sinusitis. °¨ Pneumonia. °¨ Asthma. °TREATMENT  °A URI goes away on its own with time. It cannot be cured with medicines, but medicines may be prescribed or recommended to relieve symptoms. Medicines may help: °· Reduce your fever. °· Reduce your cough. °· Relieve nasal congestion. °HOME CARE INSTRUCTIONS  °· Take medicines only as directed by your health care provider.   °· Gargle warm saltwater or take cough drops to comfort your throat as directed by your health care provider. °· Use a warm mist humidifier or inhale steam from a shower to increase air moisture. This may make it easier to breathe. °· Drink enough fluid to keep your urine clear or pale yellow.   °· Eat soups and other clear broths and maintain good nutrition.   °· Rest as needed.   °· Return to work when your temperature has returned to normal or as your health care provider advises. You may need to stay home longer to avoid infecting others. You can also use a face mask and careful hand washing to prevent spread of the virus. °· Increase the usage of your inhaler if you have asthma.   °· Do not use any tobacco products, including cigarettes, chewing tobacco, or electronic cigarettes. If you need help quitting, ask your health care provider. °PREVENTION  °The best way to protect  yourself from getting a cold is to practice good hygiene.  °· Avoid oral or hand contact with people with cold symptoms.   °· Wash your hands often if contact occurs.   °There is no clear evidence that vitamin C, vitamin E, echinacea, or exercise reduces the chance of developing a cold. However, it is always recommended to get plenty of rest, exercise, and practice good nutrition.  °SEEK MEDICAL CARE IF:  °· You are getting worse rather than better.   °· Your symptoms are not controlled by medicine.   °· You have chills. °· You have worsening shortness of breath. °· You have brown or red mucus. °· You have yellow or brown nasal discharge. °· You have pain in your face, especially when you bend forward. °· You have a fever. °· You have swollen neck glands. °· You have pain while swallowing. °· You have white areas in the back of your throat. °SEEK IMMEDIATE MEDICAL CARE IF:  °· You have severe or persistent: °¨ Headache. °¨ Ear pain. °¨   Sinus pain. °¨ Chest pain. °· You have chronic lung disease and any of the following: °¨ Wheezing. °¨ Prolonged cough. °¨ Coughing up blood. °¨ A change in your usual mucus. °· You have a stiff neck. °· You have changes in your: °¨ Vision. °¨ Hearing. °¨ Thinking. °¨ Mood. °MAKE SURE YOU:  °· Understand these instructions. °· Will watch your condition. °· Will get help right away if you are not doing well or get worse. °  °This information is not intended to replace advice given to you by your health care provider. Make sure you discuss any questions you have with your health care provider. °  °Document Released: 04/01/2001 Document Revised: 02/20/2015 Document Reviewed: 01/11/2014 °Elsevier Interactive Patient Education ©2016 [employer] Inc. ° °

## 2016-01-27 NOTE — ED Notes (Signed)
Pt reports that she awoke this am with woresening cough and congestion x 2 months, reports this am cough got progressively worse,

## 2016-01-27 NOTE — ED Notes (Signed)
Pt states she decided to come and be evaluated while she was out shopping

## 2016-01-31 ENCOUNTER — Emergency Department (HOSPITAL_COMMUNITY)
Admission: EM | Admit: 2016-01-31 | Discharge: 2016-01-31 | Disposition: A | Discharge status: Home / Self Care | Payer: PPO | Attending: Emergency Medicine | Admitting: Emergency Medicine

## 2016-01-31 ENCOUNTER — Encounter (HOSPITAL_COMMUNITY): Payer: Self-pay

## 2016-01-31 ENCOUNTER — Emergency Department (HOSPITAL_COMMUNITY): Payer: PPO

## 2016-01-31 DIAGNOSIS — E079 Disorder of thyroid, unspecified: Secondary | ICD-10-CM | POA: Insufficient documentation

## 2016-01-31 DIAGNOSIS — F419 Anxiety disorder, unspecified: Secondary | ICD-10-CM | POA: Insufficient documentation

## 2016-01-31 DIAGNOSIS — R0602 Shortness of breath: Secondary | ICD-10-CM | POA: Diagnosis not present

## 2016-01-31 DIAGNOSIS — Z880 Allergy status to penicillin: Secondary | ICD-10-CM | POA: Insufficient documentation

## 2016-01-31 DIAGNOSIS — K219 Gastro-esophageal reflux disease without esophagitis: Secondary | ICD-10-CM | POA: Diagnosis not present

## 2016-01-31 DIAGNOSIS — Z87891 Personal history of nicotine dependence: Secondary | ICD-10-CM | POA: Diagnosis not present

## 2016-01-31 DIAGNOSIS — R05 Cough: Secondary | ICD-10-CM | POA: Diagnosis not present

## 2016-01-31 DIAGNOSIS — Z7982 Long term (current) use of aspirin: Secondary | ICD-10-CM | POA: Insufficient documentation

## 2016-01-31 DIAGNOSIS — Z91040 Latex allergy status: Secondary | ICD-10-CM | POA: Diagnosis not present

## 2016-01-31 DIAGNOSIS — R059 Cough, unspecified: Secondary | ICD-10-CM

## 2016-01-31 DIAGNOSIS — Z87442 Personal history of urinary calculi: Secondary | ICD-10-CM | POA: Diagnosis not present

## 2016-01-31 DIAGNOSIS — Z79899 Other long term (current) drug therapy: Secondary | ICD-10-CM | POA: Insufficient documentation

## 2016-01-31 LAB — BASIC METABOLIC PANEL
ANION GAP: 10 (ref 5–15)
BUN: 6 mg/dL (ref 6–20)
CHLORIDE: 99 mmol/L — AB (ref 101–111)
CO2: 26 mmol/L (ref 22–32)
Calcium: 8.8 mg/dL — ABNORMAL LOW (ref 8.9–10.3)
Creatinine, Ser: 0.65 mg/dL (ref 0.44–1.00)
GFR calc Af Amer: >60 mL/min (ref >60)
Glucose, Bld: 110 mg/dL — ABNORMAL HIGH (ref 65–99)
POTASSIUM: 3.8 mmol/L (ref 3.5–5.1)
SODIUM: 135 mmol/L (ref 135–145)

## 2016-01-31 LAB — CBC WITH DIFFERENTIAL/PLATELET
BASOS ABS: 0 10*3/uL (ref 0.0–0.1)
Basophils Relative: 0 %
EOS ABS: 0.7 10*3/uL (ref 0.0–0.7)
EOS PCT: 7 %
HCT: 44.8 % (ref 36.0–46.0)
HEMOGLOBIN: 15 g/dL (ref 12.0–15.0)
LYMPHS ABS: 2.1 10*3/uL (ref 0.7–4.0)
LYMPHS PCT: 20 %
MCH: 30.8 pg (ref 26.0–34.0)
MCHC: 33.5 g/dL (ref 30.0–36.0)
MCV: 92 fL (ref 78.0–100.0)
Monocytes Absolute: 0.6 10*3/uL (ref 0.1–1.0)
Monocytes Relative: 6 %
NEUTROS PCT: 67 %
Neutro Abs: 7.1 10*3/uL (ref 1.7–7.7)
PLATELETS: 331 10*3/uL (ref 150–400)
RBC: 4.87 MIL/uL (ref 3.87–5.11)
RDW: 12.6 % (ref 11.5–15.5)
WBC: 10.5 10*3/uL (ref 4.0–10.5)

## 2016-01-31 LAB — I-STAT TROPONIN, ED: Troponin i, poc: 0 ng/mL (ref 0.00–0.08)

## 2016-01-31 MED ORDER — METHYLPREDNISOLONE ACETATE 40 MG/ML IJ SUSP
120.0000 mg | Freq: Once | INTRAMUSCULAR | Status: AC
  Administered 2016-01-31: 120 mg via INTRAMUSCULAR

## 2016-01-31 NOTE — ED Notes (Signed)
Patient here with increased shortness of breath and cough x 1 day. Seen at [facility] this past weekend and today feels SOB. Thinks a piece of chewing gum may have lodges in her trachea. Speaking complete sentences

## 2016-01-31 NOTE — ED Provider Notes (Signed)
CSN: [id number]     Arrival date & time 01/31/16  1433 History   First MD Initiated Contact with Patient 01/31/16 1713     Chief Complaint  Patient presents with  . Shortness of Breath  . Cough     (Consider location/radiation/quality/duration/timing/severity/associated sxs/prior Treatment) HPI 74 y.o. female with history of productive cough and shortness of breath sx since September 2016 with a history of extensive outpatient workup by pulmonology which has been thus far negative for pulmonary disease. She returns to the ED for acute on chronic exacerbation of her sx since Sunday. She has been given steroids for the symptoms in the past and has had significant relief of her sx in the past for a period of time. She has been evaluated by GI and it is felt that her sx are likely due to dysphagia. She is s/p multiple esophageal dilations, most recent of which was in 11/2015. She states that she has been able to tolerate a soft, predominantly liquid diet. She was recently evaluated in the ED for the same symptoms and found to have a negative workup. She denies any fever, chills, rhinorrhea, nausea, vomiting, diarrhea. She states that she thinks that perhaps her sx this time were exacerbated by chewing gum. She states that her sx got worse while chewing it, denies aspirating it, removed it from her mouth and set it aside. She queries that maybe "a small piece of the gum went down the wrong pipe." She denies any known history of prior aspiration.   Past Medical History  Diagnosis Date  . Thyroid disease   . Diverticulitis   . Kidney stones   . IBS (irritable bowel syndrome)   . Allergy     seasonal  . GERD (gastroesophageal reflux disease)    Past Surgical History  Procedure Laterality Date  . Facial reconstruction surgery    . Hysterotomy    . Thyroidectomy, partial    . Colonoscopy    . Upper gastrointestinal endoscopy     Family History  Problem Relation Age of Onset  . Colon cancer Neg  Hx   . Stomach cancer Neg Hx   . Heart disease Mother   . Diabetes Mother   . Kidney disease Father   . Breast cancer Sister   . Breast cancer Maternal Aunt   . Liver cancer Maternal Aunt   . Diabetes Maternal Aunt   . Liver cancer Paternal Uncle   . Breast cancer Maternal Grandmother   . Diabetes Maternal Grandmother    Social History  Substance Use Topics  . Smoking status: Former Smoker -- 1.00 packs/day for 15 years    Types: Cigarettes    Quit date: 10/21/1975  . Smokeless tobacco: Never Used  . Alcohol Use: 0.0 oz/week    0 Standard drinks or equivalent per week     Comment: 1-2 glasses of wine a month   OB History    No data available     Review of Systems  Constitutional: Negative for fever, chills, activity change and appetite change.  HENT: Negative for congestion, rhinorrhea, sinus pressure, sneezing and sore throat.   Respiratory: Positive for cough and shortness of breath. Negative for chest tightness and wheezing.   Gastrointestinal: Negative for nausea, vomiting, abdominal pain and diarrhea.  Musculoskeletal: Negative for back pain and neck pain.  Skin: Negative for rash.  Neurological: Negative for syncope, facial asymmetry, weakness, light-headedness, numbness and headaches.  Psychiatric/Behavioral: The patient is nervous/anxious.   All other systems reviewed  and are negative.     Allergies  Azithromycin; Codeine; Minocin; Peanut-containing drug products; Penicillins; Sulfa antibiotics; Sulfacetamide sodium; Iohexol; Latex; Other; and Pseudoephedrine-naproxen na er  Home Medications   Prior to Admission medications   Medication Sig Start Date End Date Taking? Authorizing Provider  aspirin 81 MG tablet Take 81 mg by mouth as needed for pain.   Yes Historical Provider, MD  ESTRACE VAGINAL 0.1 MG/GM vaginal cream Place 1 Applicatorful vaginally 2 (two) times a week.  10/29/14  Yes Historical Provider, MD  levothyroxine (SYNTHROID, LEVOTHROID) 88 MCG  tablet Take 88 mcg by mouth daily before breakfast.   Yes Historical Provider, MD  omeprazole (PRILOSEC) 40 MG capsule TAKE ONE CAPSULE TWICE A DAY TAKE 30-60 MINUTES BEFORE MORNING AND EVENING MEAL(MAX 1 /DAY PER INS) 12/06/15  Yes Historical Provider, MD   BP 159/74 mmHg  Pulse 89  Temp(Src) 97.6 F (36.4 C) (Oral)  Resp 20  SpO2 94% Physical Exam  Constitutional: She is oriented to person, place, and time. She appears well-developed and well-nourished. No distress.  HENT:  Head: Normocephalic and atraumatic.  Nose: Nose normal.  Mouth/Throat: Oropharynx is clear and moist.  Eyes: Conjunctivae and EOM are normal. Pupils are equal, round, and reactive to light.  Neck: Normal range of motion. Neck supple.  Cardiovascular: Normal rate, regular rhythm, normal heart sounds and intact distal pulses.   Pulmonary/Chest: Effort normal and breath sounds normal.  Abdominal: Soft. She exhibits no distension. There is no tenderness.  Musculoskeletal: She exhibits no edema or tenderness.  Neurological: She is alert and oriented to person, place, and time. No cranial nerve deficit. Coordination normal.  Skin: Skin is warm and dry. No rash noted. She is not diaphoretic.  Psychiatric: Her mood appears anxious.  Nursing note and vitals reviewed.   ED Course  Procedures (including critical care time) Labs Review Labs Reviewed  BASIC METABOLIC PANEL - Abnormal; Notable for the following:    Chloride 99 (*)    Glucose, Bld 110 (*)    Calcium 8.8 (*)    All other components within normal limits  CBC WITH DIFFERENTIAL/PLATELET  Randolm Idol, ED    Imaging Review Dg Chest 2 View  01/31/2016  CLINICAL DATA:  Cough, shortness of Breath EXAM: CHEST  2 VIEW COMPARISON:  01/27/2016 FINDINGS: Cardiomediastinal silhouette is stable. No acute infiltrate or pleural effusion. No pulmonary edema. Atherosclerotic calcifications of thoracic aorta again noted. IMPRESSION: No active cardiopulmonary disease.  Electronically Signed   By: Lahoma Crocker M.D.   On: 01/31/2016 15:41   I have personally reviewed and evaluated these images and lab results as part of my medical decision-making.   EKG Interpretation   Date/Time:  Thursday January 31 2016 14:42:34 EDT Ventricular Rate:  77 PR Interval:  138 QRS Duration: 66 QT Interval:  386 QTC Calculation: 436 R Axis:   71 Text Interpretation:  Normal sinus rhythm ST \\T \ T wave abnormality,  consider inferior ischemia Abnormal ECG No significant change since last  tracing Confirmed by Winfred Leeds  MD, SAM [phone]) on 01/31/2016 5:44:47 PM      MDM  75 y.o. female presents to the ED noting an acute on chronic exacerbation of her symptoms of intermittently productive cough and shortness of breath. Her outpatient evaluation thus far has been more suggestive of a GI etiology of her sx and she follows with GI for this. No significant pulmonary hx and negative pulmonary workup thus far. She notes history of improvement in her sx with  steroids. She queries a possible correlation of her worsening of her sx with her chewing gum, though feel that this is unlikely given no hx of aspiration of it. Physical exam reassuring, as above with no abnormalities noted other than an anxious appearing, thin female.  She states that her sx are worse with lying flat, though she was able to lay flat in the room speaking in full sentences with no change in her sx. Labs returned showing no acute abnormality. CXR shows no acute abnormality. She was given a dose of steroids in the ED for further symptomatic relief. Feel that she may be having dysphagia and possible esophageal spasms, GERD, or seasonal respiratory allergies, as there is a lot of pollen currently and some of her AoC exacerbated sx may be associated with this. She was recommended to continue taking her rx'd medications including PPI. Feel that anxiety also likely significantly contributing to her sx. She was recommended to follow  up closely with her PCP for further evaluation of her sx. Return precautions were given. This plan was discussed with the patient at the bedside and she stated both understanding and agreement. She was then discharged home in good condition.   Final diagnoses:  Cough  Shortness of breath       Zenovia Jarred, DO 02/01/16 [place], MD 02/02/16 [id number]

## 2016-01-31 NOTE — Discharge Instructions (Signed)

## 2016-01-31 NOTE — ED Provider Notes (Signed)
Patient complains of cough productive of green sputum since September 2016. She states breathing is worse when she lies down improved with sitting up. She reports that she was chewing gum when symptoms started and she thinks that a tiny piece of gum may have lodged in her throat however she was able to pull the chewing gum out of her mouth. Associated symptoms include nasal congestion. No other associated symptoms. No treatment prior to coming here She denies fever. She's been of value by pulmonary service for same complaint treated with Depo-Medrol with relief of symptoms felt largely due to esophageal dysmotility. On exam she is alert no distress lungs clear auscultation heart regular rate and rhythm.  Patient has done well in the past with Depo-Medrol. Therefore we for Depo-Medrol here.  Chest x-ray viewed by me. Medical decision-making patient with long-standing illness, in my opinion etiology unclear. I feel there may be an element of environmental allergy due to large amount of pollen in the air presently and symptoms worsening in the past week   Orlie Dakin, MD 01/31/16 2358

## 2016-02-14 DIAGNOSIS — R1314 Dysphagia, pharyngoesophageal phase: Secondary | ICD-10-CM | POA: Diagnosis not present

## 2016-02-26 DIAGNOSIS — K210 Gastro-esophageal reflux disease with esophagitis: Secondary | ICD-10-CM | POA: Diagnosis not present

## 2016-02-26 DIAGNOSIS — R531 Weakness: Secondary | ICD-10-CM | POA: Diagnosis not present

## 2016-02-26 DIAGNOSIS — F458 Other somatoform disorders: Secondary | ICD-10-CM | POA: Diagnosis not present

## 2016-02-26 DIAGNOSIS — R5383 Other fatigue: Secondary | ICD-10-CM | POA: Diagnosis not present

## 2016-02-26 DIAGNOSIS — R1312 Dysphagia, oropharyngeal phase: Secondary | ICD-10-CM | POA: Diagnosis not present

## 2016-03-11 ENCOUNTER — Telehealth: Payer: Self-pay | Admitting: Internal Medicine

## 2016-03-11 DIAGNOSIS — R1310 Dysphagia, unspecified: Secondary | ICD-10-CM

## 2016-03-11 NOTE — Telephone Encounter (Signed)
Left message for patient to call back Last recorded weight in EPIC from April was 121 lbs

## 2016-03-12 NOTE — Telephone Encounter (Signed)
Patient will come by today to sign paperwork

## 2016-03-12 NOTE — Telephone Encounter (Signed)
Patient reports that she has not been able to swallow solids for the last few weeks.  She noticed worsening of her dysphagia "when my prednisone shot wore off".  She has had multiple upper resp infections since mid April. Has lost from 121 lbs to 104 lbs. She is drinking about 4 Ensure a day.  She is only eat consistence of apple sauce.  Please advise next step.  Last dilation of esophageal Web

## 2016-03-12 NOTE — Telephone Encounter (Signed)
While speaking to the patient she has a cough that she describes as chronic.  She does have DOE, but she says just getting ready for work makes her SOB.  No APP appts.  Do you still want me to set up for ?

## 2016-03-12 NOTE — Telephone Encounter (Signed)
I can scope her tomorrow at 4P and dilate - would need to think resp status is ok for [place] - ok screening by phone but any ? She should see an APP today

## 2016-03-12 NOTE — Telephone Encounter (Signed)
I think those sxs are chronic ok to schedule egd here if she is willing

## 2016-03-13 ENCOUNTER — Ambulatory Visit (AMBULATORY_SURGERY_CENTER): Payer: PPO | Admitting: Internal Medicine

## 2016-03-13 ENCOUNTER — Encounter: Payer: Self-pay | Admitting: Internal Medicine

## 2016-03-13 VITALS — BP 129/61 | HR 79 | Temp 98.7°F | Resp 17 | Ht 61.0 in | Wt 104.0 lb

## 2016-03-13 DIAGNOSIS — E039 Hypothyroidism, unspecified: Secondary | ICD-10-CM | POA: Diagnosis not present

## 2016-03-13 DIAGNOSIS — B3781 Candidal esophagitis: Secondary | ICD-10-CM | POA: Diagnosis not present

## 2016-03-13 DIAGNOSIS — R1310 Dysphagia, unspecified: Secondary | ICD-10-CM

## 2016-03-13 DIAGNOSIS — K219 Gastro-esophageal reflux disease without esophagitis: Secondary | ICD-10-CM | POA: Diagnosis not present

## 2016-03-13 HISTORY — DX: Candidal esophagitis: B37.81

## 2016-03-13 MED ORDER — DEXTROSE 5 % IV SOLN: INTRAVENOUS | Status: DC

## 2016-03-13 MED ORDER — FLUCONAZOLE 100 MG PO TABS: 100.0000 mg | ORAL_TABLET | Freq: Every day | ORAL | Status: DC

## 2016-03-13 MED ORDER — FLUCONAZOLE 40 MG/ML PO SUSR: 100.0000 mg | Freq: Every day | ORAL | Status: AC

## 2016-03-13 MED ORDER — SODIUM CHLORIDE 0.9 % IV SOLN: 500.0000 mL | INTRAVENOUS | Status: DC

## 2016-03-13 NOTE — Op Note (Signed)
[place] Patient Name: Anita [name] Procedure Date: 03/13/2016 3:46 PM MRN: [id number] Endoscopist: Gatha Mayer , MD Age: 75 Referring MD:  Date of Birth: 11-09-1940 Gender: Female Procedure:                Upper GI endoscopy Indications:              Dysphagia Medicines:                Propofol per Anesthesia, Monitored Anesthesia Care Procedure:                Pre-Anesthesia Assessment:                           - Prior to the procedure, a History and Physical                            was performed, and patient medications and                            allergies were reviewed. The patient's tolerance of                            previous anesthesia was also reviewed. The risks                            and benefits of the procedure and the sedation                            options and risks were discussed with the patient.                            All questions were answered, and informed consent                            was obtained. Prior Anticoagulants: The patient has                            taken no previous anticoagulant or antiplatelet                            agents. ASA Grade Assessment: II - A patient with                            mild systemic disease. After reviewing the risks                            and benefits, the patient was deemed in                            satisfactory condition to undergo the procedure.                           After obtaining informed consent, the endoscope was  passed under direct vision. Throughout the                            procedure, the patient's blood pressure, pulse, and                            oxygen saturations were monitored continuously. The                            Model GIF-[id number]) scope was introduced                            through the mouth, and advanced to the prepyloric                            region, stomach. The upper GI endoscopy was                             accomplished without difficulty. The patient                            tolerated the procedure well. Scope In: Scope Out: Findings:                 Diffuse candidiasis was found in the entire                            esophagus.                           Diffuse mild inflammation characterized by erythema                            was found in the gastric antrum.                           The exam was otherwise without abnormality.                           The cardia and gastric fundus were normal on                            retroflexion. Complications:            No immediate complications. Estimated Blood Loss:     Estimated blood loss: none. Impression:               - Monilial esophagitis.                           - Gastritis.                           - The examination was otherwise normal.                           - No specimens collected. Recommendation:           -  Patient has a contact number available for                            emergencies. The signs and symptoms of potential                            delayed complications were discussed with the                            patient. Return to normal activities tomorrow.                            Written discharge instructions were provided to the                            patient.                           - Resume previous diet.                           - Continue present medications.                           - Diflucan (fluconazole) 100 mg PO daily for 3                            weeks.                           - Perform routine esophageal manometry if symptoms                            persist. Gatha Mayer, MD 03/13/2016 4:08:19 PM This report has been signed electronically.

## 2016-03-13 NOTE — Progress Notes (Signed)
When Randye Lobo, CMA took pt's blood pressure while checking her vital signs, when she took off the blood pressure cuff, pt's left upper arm was bright red color and caused itching.  This was where the blood pressure cuff was.  We left the cuff off until pt gets in the procedure room. maw

## 2016-03-13 NOTE — Progress Notes (Signed)
Report to PACU, RN, vss, BBS= Clear.  

## 2016-03-13 NOTE — Patient Instructions (Addendum)
You have an infection in the esophagus - yeast called candida. Steroids and antibiotics can cause this to occur. i have sent a prescription for fluconazole to treat this - I would expect you would fell better at least some within 3-4 days.  If you continue to have problems swallowing i will order a test called esophageal manometry - a small tube is placed into the esophagus after numbing the nose to measure the esophagus function (does it squeeze and relax properly?)  I appreciate the opportunity to care for you. Gatha Mayer, MD, FACG   YOU HAD AN ENDOSCOPIC PROCEDURE TODAY AT [facility]:   Refer to the procedure report that was given to you for any specific questions about what was found during the examination.  If the procedure report does not answer your questions, please call your gastroenterologist to clarify.  If you requested that your care partner not be given the details of your procedure findings, then the procedure report has been included in a sealed envelope for you to review at your convenience later.  YOU SHOULD EXPECT: Some feelings of bloating in the abdomen. Passage of more gas than usual.  Walking can help get rid of the air that was put into your GI tract during the procedure and reduce the bloating. If you had a lower endoscopy (such as a colonoscopy or flexible sigmoidoscopy) you may notice spotting of blood in your stool or on the toilet paper. If you underwent a bowel prep for your procedure, you may not have a normal bowel movement for a few days.  Please Note:  You might notice some irritation and congestion in your nose or some drainage.  This is from the oxygen used during your procedure.  There is no need for concern and it should clear up in a day or so.  SYMPTOMS TO REPORT IMMEDIATELY:    Following upper endoscopy (EGD)  Vomiting of blood or coffee ground material  New chest pain or pain under the shoulder blades  Painful or  persistently difficult swallowing  New shortness of breath  Fever of 100F or higher  Black, tarry-looking stools  For urgent or emergent issues, a gastroenterologist can be reached at any hour by calling [phone].   DIET: Your first meal following the procedure should be a small meal and then it is ok to progress to your normal diet. Heavy or fried foods are harder to digest and may make you feel nauseous or bloated.  Likewise, meals heavy in dairy and vegetables can increase bloating.  Drink plenty of fluids but you should avoid alcoholic beverages for 24 hours.  ACTIVITY:  You should plan to take it easy for the rest of today and you should NOT DRIVE or use heavy machinery until tomorrow (because of the sedation medicines used during the test).    FOLLOW UP: Our staff will call the number listed on your records the next business day following your procedure to check on you and address any questions or concerns that you may have regarding the information given to you following your procedure. If we do not reach you, we will leave a message.  However, if you are feeling well and you are not experiencing any problems, there is no need to return our call.  We will assume that you have returned to your regular daily activities without incident.  If any biopsies were taken you will be contacted by phone or by letter within the next  1-3 weeks.  Please call us at [phone] if you have not heard about the biopsies in 3 weeks.    SIGNATURES/CONFIDENTIALITY: You and/or your care partner have signed paperwork which will be entered into your electronic medical record.  These signatures attest to the fact that that the information above on your After Visit Summary has been reviewed and is understood.  Full responsibility of the confidentiality of this discharge information lies with you and/or your care-partner.

## 2016-03-14 ENCOUNTER — Telehealth: Payer: Self-pay | Admitting: *Deleted

## 2016-03-14 NOTE — Telephone Encounter (Signed)
  Follow up Call-  Call back number 03/13/2016 12/17/2015 08/28/2015  Post procedure Call Back phone  # [phone] hm [phone]  Permission to leave phone message Yes Yes Yes     Patient questions:  Do you have a fever, pain , or abdominal swelling? No. Pain Score  0 *  Have you tolerated food without any problems? Yes.    Have you been able to return to your normal activities? Yes.    Do you have any questions about your discharge instructions: Diet   No. Medications  No. Follow up visit  No.  Do you have questions or concerns about your Care? No.  Actions: * If pain score is 4 or above: No action needed, pain <4.

## 2016-03-27 DIAGNOSIS — M353 Polymyalgia rheumatica: Secondary | ICD-10-CM | POA: Diagnosis not present

## 2016-04-23 DIAGNOSIS — R1312 Dysphagia, oropharyngeal phase: Secondary | ICD-10-CM | POA: Diagnosis not present

## 2016-05-23 ENCOUNTER — Encounter (HOSPITAL_BASED_OUTPATIENT_CLINIC_OR_DEPARTMENT_OTHER): Payer: Self-pay | Admitting: Respiratory Therapy

## 2016-05-23 ENCOUNTER — Emergency Department (HOSPITAL_BASED_OUTPATIENT_CLINIC_OR_DEPARTMENT_OTHER): Payer: PPO

## 2016-05-23 ENCOUNTER — Emergency Department (HOSPITAL_BASED_OUTPATIENT_CLINIC_OR_DEPARTMENT_OTHER)
Admission: EM | Admit: 2016-05-23 | Discharge: 2016-05-23 | Disposition: A | Discharge status: Home / Self Care | Payer: PPO | Attending: Emergency Medicine | Admitting: Emergency Medicine

## 2016-05-23 DIAGNOSIS — Z87891 Personal history of nicotine dependence: Secondary | ICD-10-CM | POA: Insufficient documentation

## 2016-05-23 DIAGNOSIS — Z7982 Long term (current) use of aspirin: Secondary | ICD-10-CM | POA: Insufficient documentation

## 2016-05-23 DIAGNOSIS — R0981 Nasal congestion: Secondary | ICD-10-CM | POA: Diagnosis not present

## 2016-05-23 DIAGNOSIS — R05 Cough: Secondary | ICD-10-CM | POA: Diagnosis not present

## 2016-05-23 DIAGNOSIS — R062 Wheezing: Secondary | ICD-10-CM | POA: Insufficient documentation

## 2016-05-23 DIAGNOSIS — R059 Cough, unspecified: Secondary | ICD-10-CM

## 2016-05-23 LAB — BASIC METABOLIC PANEL
ANION GAP: 6 (ref 5–15)
BUN: 11 mg/dL (ref 6–20)
CHLORIDE: 100 mmol/L — AB (ref 101–111)
CO2: 30 mmol/L (ref 22–32)
Calcium: 8.9 mg/dL (ref 8.9–10.3)
Creatinine, Ser: 0.64 mg/dL (ref 0.44–1.00)
GFR calc non Af Amer: >60 mL/min (ref >60)
Glucose, Bld: 90 mg/dL (ref 65–99)
POTASSIUM: 4.1 mmol/L (ref 3.5–5.1)
SODIUM: 136 mmol/L (ref 135–145)

## 2016-05-23 LAB — CBC WITH DIFFERENTIAL/PLATELET
BASOS ABS: 0.1 10*3/uL (ref 0.0–0.1)
BASOS PCT: 1 %
Eosinophils Absolute: 1.2 10*3/uL — ABNORMAL HIGH (ref 0.0–0.7)
Eosinophils Relative: 13 %
HEMATOCRIT: 42.1 % (ref 36.0–46.0)
HEMOGLOBIN: 14.3 g/dL (ref 12.0–15.0)
LYMPHS PCT: 34 %
Lymphs Abs: 3.2 10*3/uL (ref 0.7–4.0)
MCH: 31 pg (ref 26.0–34.0)
MCHC: 34 g/dL (ref 30.0–36.0)
MCV: 91.1 fL (ref 78.0–100.0)
MONO ABS: 1 10*3/uL (ref 0.1–1.0)
Monocytes Relative: 11 %
NEUTROS ABS: 3.7 10*3/uL (ref 1.7–7.7)
NEUTROS PCT: 41 %
Platelets: 293 10*3/uL (ref 150–400)
RBC: 4.62 MIL/uL (ref 3.87–5.11)
RDW: 12.6 % (ref 11.5–15.5)
WBC: 9.2 10*3/uL (ref 4.0–10.5)

## 2016-05-23 LAB — BRAIN NATRIURETIC PEPTIDE: B Natriuretic Peptide: 19.5 pg/mL (ref 0.0–100.0)

## 2016-05-23 MED ORDER — DEXAMETHASONE SODIUM PHOSPHATE 10 MG/ML IJ SOLN
10.0000 mg | Freq: Once | INTRAMUSCULAR | Status: AC
  Administered 2016-05-23: 10 mg via INTRAMUSCULAR
  Filled 2016-05-23: qty 1

## 2016-05-23 NOTE — ED Provider Notes (Signed)
[facility] DEPT [facility] Provider Note   CSN: [id number] Arrival date & time: 05/23/16  2056  First Provider Contact:  First MD Initiated Contact with Patient 05/23/16 2201      By signing my name below, I, Evelene Croon, attest that this documentation has been prepared under the direction and in the presence of Forde Dandy, MD . Electronically Signed: Evelene Croon, Scribe. 05/23/2016. 12:51 AM.  History   Chief Complaint Chief Complaint  Patient presents with  . Cough     The history is provided by the patient. No language interpreter was used.    HPI Comments:  Anita [name] is a 75 y.o. female who presents to the Emergency Department complaining of productive cough x 2 weeks; worse over the last 3 days. Her cough is exacerbated at night. She describes greenish colored sputum and reports associated wheezing and congestion. Pt notes she has been dealing with these same URI symptoms for ~ 1 years and has been taking prednisone  and mucinex for those symptoms intermittently with moderate relief.  Allergic to albuterol inhaler she states. She denies h/o underlying lung disease and ruled out for lung problems by Dr. Melvyn Novas, she states. She also notes she was recently placed on fluconazone for a fungal infection and states she is currently being evaluated by GI for dysphagia; her GI believes her URI symptoms may be due to this. She denies fever, CP, and BLE swelling.   [facility]  Past Medical History:  Diagnosis Date  . Allergy    seasonal  . Cough   . Diverticulitis   . GERD (gastroesophageal reflux disease)   . IBS (irritable bowel syndrome)   . Kidney stones    right kidney  . Thyroid disease     Patient Active Problem List   Diagnosis Date Noted  . Candida esophagitis ([place]) 03/13/2016  . Dysphagia, pharyngoesophageal phase 09/20/2015  . Upper airway cough syndrome 09/20/2015    Past Surgical History:  Procedure Laterality Date  . COLONOSCOPY    . FACIAL  RECONSTRUCTION SURGERY    . HYSTEROTOMY    . lipoma right shoulder removal    . THYROIDECTOMY, PARTIAL    . TUBAL LIGATION    . UPPER GASTROINTESTINAL ENDOSCOPY      OB History    No data available       Home Medications    Prior to Admission medications   Medication Sig Start Date End Date Taking? Authorizing Provider  aspirin 81 MG tablet Take 81 mg by mouth as needed for pain.    Historical Provider, MD  ESTRACE VAGINAL 0.1 MG/GM vaginal cream Place 1 Applicatorful vaginally 2 (two) times a week.  10/29/14   Historical Provider, MD  levothyroxine (SYNTHROID, LEVOTHROID) 88 MCG tablet Take 88 mcg by mouth daily before breakfast.    Historical Provider, MD  omeprazole (PRILOSEC) 40 MG capsule TAKE ONE CAPSULE TWICE A DAY TAKE 30-60 MINUTES BEFORE MORNING AND EVENING MEAL(MAX 1 /DAY PER INS) 12/06/15   Historical Provider, MD    Family History Family History  Problem Relation Age of Onset  . Heart disease Mother   . Diabetes Mother   . Kidney disease Father   . Breast cancer Sister   . Breast cancer Maternal Aunt   . Liver cancer Maternal Aunt   . Diabetes Maternal Aunt   . Liver cancer Paternal Uncle   . Breast cancer Maternal Grandmother   . Diabetes Maternal Grandmother   . Colon cancer Neg Hx   .  Stomach cancer Neg Hx   . Esophageal cancer Neg Hx   . Pancreatic cancer Neg Hx   . Rectal cancer Neg Hx     Social History Social History  Substance Use Topics  . Smoking status: Former Smoker    Packs/day: 1.00    Years: 15.00    Types: Cigarettes    Quit date: 10/21/1975  . Smokeless tobacco: Never Used  . Alcohol use 1.2 oz/week    2 Glasses of wine per week     Comment: 1-2 glasses of wine a week     Allergies   Azithromycin; Codeine; Minocin [minocycline]; Peanut-containing drug products; Penicillins; Sulfa antibiotics; Sulfacetamide sodium; Iohexol; Latex; Other; and Pseudoephedrine-naproxen na er   Review of Systems Review of Systems  Constitutional:  Negative for fever.  HENT: Positive for congestion.   Respiratory: Positive for cough and wheezing.   Cardiovascular: Negative for leg swelling.  All other systems reviewed and are negative.   Physical Exam Updated Vital Signs BP 112/68 (BP Location: Right Arm)   Pulse 64   Temp 97.8 F (36.6 C) (Oral)   Resp 16   Ht 5\' 2"  (1.575 m)   Wt 106 lb (48.1 kg)   SpO2 94%   BMI 19.39 kg/m   Physical Exam Physical Exam  Nursing note and vitals reviewed. Constitutional: Well developed, well nourished, non-toxic, and in no acute distress Head: Normocephalic and atraumatic.  Mouth/Throat: Oropharynx is clear and moist.  Neck: Normal range of motion. Neck supple.  Cardiovascular: Normal rate and regular rhythm.   Pulmonary/Chest: Effort normal and breath sounds normal. Bronchospastic cough   Abdominal: Soft. There is no tenderness. There is no rebound and no guarding.  Musculoskeletal: Normal range of motion.  Neurological: Alert, no facial droop, fluent speech, moves all extremities symmetrically Skin: Skin is warm and dry.  Psychiatric: Cooperative   ED Treatments / Results  DIAGNOSTIC STUDIES:  Oxygen Saturation is 93% on RA, adequate by my interpretation.    COORDINATION OF CARE:  10:12 PM Discussed treatment plan with pt at bedside and pt agreed to plan.  Labs (all labs ordered are listed, but only abnormal results are displayed) Labs Reviewed  CBC WITH DIFFERENTIAL/PLATELET - Abnormal; Notable for the following:       Result Value   Eosinophils Absolute 1.2 (*)    All other components within normal limits  BASIC METABOLIC PANEL - Abnormal; Notable for the following:    Chloride 100 (*)    All other components within normal limits  BRAIN NATRIURETIC PEPTIDE    EKG  EKG Interpretation None       Radiology Dg Chest 2 View  Result Date: 05/23/2016 CLINICAL DATA:  Patient with cough and upper respiratory tract infection symptoms for 1 week. EXAM: CHEST  2 VIEW  COMPARISON:  Chest radiograph 01/31/2016. FINDINGS: Stable cardiac and mediastinal contours. Pulmonary hyperinflation. Emphysematous change. No consolidative pulmonary opacities. Probable nipple shadow left lower hemi thorax. No pleural effusion or pneumothorax. Thoracic spine degenerative changes. Aortic vascular calcifications. IMPRESSION: No acute cardiopulmonary process. Pulmonary hyperinflation. Aortic vascular calcification. Electronically Signed   By: Lovey Newcomer M.D.   On: 05/23/2016 21:27    Procedures Procedures   Medications Ordered in ED Medications  dexamethasone (DECADRON) injection 10 mg (10 mg Intramuscular Given 05/23/16 2240)     Initial Impression / Assessment and Plan / ED Course  I have reviewed the triage vital signs and the nursing notes.  Pertinent labs & imaging results that were available  during my care of the patient were reviewed by me and considered in my medical decision making (see chart for details).  Clinical Course    75 year old female who presents with cough, congestion, and wheezing. Presentation is nontoxic in no acute distress with normal vital signs and normal work of breathing. Lungs are clear but she has bronchospastic cough. No evidence of fluid or pneumonia on her chest x-ray. Basic blood work BMP normal. Suspect potential viral illness, but may be also related to chronic GI problems as this is recurrent issue for her over the past year. Unable to take inhalers for bronchospastic cough due to allergy, and is given dose of Decadron which she states that she responded well to in the past. I felt appropriate for discharge home with continued outpatient management with her GI doctor as well as with her primary care physician. Strict return and follow-up instructions reviewed. She expressed understanding of all discharge instructions and felt comfortable with the plan of care.   The patient appears reasonably screened and/or stabilized for discharge and I  doubt any other medical condition or other [place] requiring further screening, evaluation, or treatment in the ED at this time prior to discharge.   Final Clinical Impressions(s) / ED Diagnoses   Final diagnoses:  Cough    New Prescriptions Discharge Medication List as of 05/23/2016 11:39 PM     I personally performed the services described in this documentation, which was scribed in my presence. The recorded information has been reviewed and is accurate.    Forde Dandy, MD 05/24/16 [phone]

## 2016-05-23 NOTE — Discharge Instructions (Signed)
Your x-ray does not show pneumonia or fluid.   Return for worsening symptoms, including difficulty breathing, severe chest pain, passing out or any other symptoms concerning to you.

## 2016-05-23 NOTE — ED Notes (Signed)
Pt has on going cough but has been worse last 2 week  Productive,  Greenish/brown

## 2016-05-23 NOTE — ED Triage Notes (Signed)
Pt c/o cough and URI symptoms x 1 week

## 2016-06-09 ENCOUNTER — Telehealth: Payer: Self-pay | Admitting: Internal Medicine

## 2016-06-09 NOTE — Telephone Encounter (Signed)
Patient is scheduled to see Dr. Carlean Purl on 06/11/16 11:30

## 2016-06-11 ENCOUNTER — Ambulatory Visit (INDEPENDENT_AMBULATORY_CARE_PROVIDER_SITE_OTHER): Payer: PPO | Admitting: Internal Medicine

## 2016-06-11 ENCOUNTER — Encounter: Payer: Self-pay | Admitting: Internal Medicine

## 2016-06-11 VITALS — BP 110/64 | HR 80 | Ht 61.5 in | Wt 110.6 lb

## 2016-06-11 DIAGNOSIS — R0789 Other chest pain: Secondary | ICD-10-CM

## 2016-06-11 DIAGNOSIS — R05 Cough: Secondary | ICD-10-CM | POA: Diagnosis not present

## 2016-06-11 DIAGNOSIS — B3781 Candidal esophagitis: Secondary | ICD-10-CM | POA: Diagnosis not present

## 2016-06-11 DIAGNOSIS — R058 Other specified cough: Secondary | ICD-10-CM

## 2016-06-11 NOTE — Patient Instructions (Signed)
  Sorry to hear the cough is still a problem. Please keep seeing Dr. Joya Gaskins and Gregary Signs at [facility] See me as needed.   I appreciate the opportunity to care for you. Gatha Mayer, MD, [facility]

## 2016-06-11 NOTE — Assessment & Plan Note (Signed)
Maybe a little better 

## 2016-06-11 NOTE — Assessment & Plan Note (Signed)
Dysphagia resolved so would not retreat

## 2016-06-11 NOTE — Progress Notes (Signed)
   Subjective:    Patient ID: Anita [name], female    DOB: 1940-11-03, 75 y.o.   MRN: [id number]  HPI Here for f/u Still w/ cough, severe at times and makes her light-headed and dizzy She says she gets episodes with mucous and phlegm caught and must bring it up  Dysphagia and eating better after I Tx esophageal candidiasis Weight up  Cough similar - seeing speech path at [facility] - notes reviewed. Think that she lacks insight into nature of her cough and how to go about suppressing the urge, etc.  Wt Readings from Last 3 Encounters:  06/11/16 110 lb 9.6 oz (50.2 kg)  05/23/16 106 lb (48.1 kg)  03/13/16 104 lb (47.2 kg)   Medications, allergies, past medical history, past surgical history, family history and social history are reviewed and updated in the EMR.  Review of Systems As above    Objective:   Physical Exam BP 110/64 (BP Location: Left Arm, Patient Position: Sitting, Cuff Size: Normal)   Pulse 80   Ht 5' 1.5" (1.562 m)   Wt 110 lb 9.6 oz (50.2 kg)   BMI 20.56 kg/m  Sputum - yellow/tan in cup shwe brought Coughs frequently Upper chest wall slightly tender       Assessment & Plan:   Encounter Diagnoses  Name Primary?  Marland Kitchen Upper airway cough syndrome Yes  . Esophageal candidiasis ([place]) - treated   . Chest wall pain     I doubt this is a GI issue I.e. GERD. We discussed possibly trying mano/impedance testing to be more sure but Dr. Joya Gaskins said she did not tolerate the laryngoscopy well at all and did notthink she would tolerate it - I agree. She will keep f/u w/ Dr. Joya Gaskins at [facility] and the speech path team. I reviewed the ideas behind cyclical and persistent cough and how it could be behavioral (some/all) and also explained how anxiety issues could play a role if she was having any though she does not sense that is aproblem.  I reassured her as best I could.  Chest wall pain from coughing.  15 minutes time spent with patient > half in counseling  coordination of care [id number], MD

## 2016-06-30 ENCOUNTER — Encounter: Payer: Self-pay | Admitting: Internal Medicine

## 2016-07-03 DIAGNOSIS — J209 Acute bronchitis, unspecified: Secondary | ICD-10-CM | POA: Diagnosis not present

## 2016-08-25 ENCOUNTER — Ambulatory Visit (HOSPITAL_COMMUNITY)
Admission: EM | Admit: 2016-08-25 | Discharge: 2016-08-25 | Disposition: A | Discharge status: Home / Self Care | Payer: PPO | Attending: Family Medicine | Admitting: Family Medicine

## 2016-08-25 ENCOUNTER — Encounter (HOSPITAL_COMMUNITY): Payer: Self-pay | Admitting: Emergency Medicine

## 2016-08-25 DIAGNOSIS — K2100 Gastro-esophageal reflux disease with esophagitis, without bleeding: Secondary | ICD-10-CM

## 2016-08-25 DIAGNOSIS — R059 Cough, unspecified: Secondary | ICD-10-CM

## 2016-08-25 DIAGNOSIS — J40 Bronchitis, not specified as acute or chronic: Secondary | ICD-10-CM | POA: Diagnosis not present

## 2016-08-25 DIAGNOSIS — R05 Cough: Secondary | ICD-10-CM | POA: Diagnosis not present

## 2016-08-25 MED ORDER — CLINDAMYCIN HCL 150 MG PO CAPS: 150.0000 mg | ORAL_CAPSULE | Freq: Three times a day (TID) | ORAL | 0 refills | Status: DC

## 2016-08-25 MED ORDER — METHYLPREDNISOLONE ACETATE 80 MG/ML IJ SUSP
INTRAMUSCULAR | Status: AC
  Filled 2016-08-25: qty 1

## 2016-08-25 MED ORDER — METHYLPREDNISOLONE ACETATE 80 MG/ML IJ SUSP
80.0000 mg | Freq: Once | INTRAMUSCULAR | Status: AC
  Administered 2016-08-25: 80 mg via INTRAMUSCULAR

## 2016-08-25 MED ORDER — RANITIDINE HCL 300 MG PO TABS: 300.0000 mg | ORAL_TABLET | Freq: Every day | ORAL | 0 refills | Status: DC

## 2016-08-25 NOTE — ED Triage Notes (Signed)
Pt. Stated, I have acid reflux with closing up where my food doesn't go down. Im also congested

## 2016-08-25 NOTE — ED Provider Notes (Signed)
CSN: [id number]     Arrival date & time 08/25/16  1652 History   None    Chief Complaint  Patient presents with  . Gastroesophageal Reflux  . Nasal Congestion   (Consider location/radiation/quality/duration/timing/severity/associated sxs/prior Treatment) Patient has been having worsening GI sx's and she is having some difficulty with swallowing.  She has been seeing GI.  She states she gets associated bronchitis or reactive airways due to  Her GI sx's.  She states she usually gets a shot of depomedrol and it helps her breathing.   The history is provided by the patient.  Gastroesophageal Reflux  This is a new problem. The current episode started 2 days ago. The problem occurs constantly. The problem has not changed since onset.Associated symptoms include shortness of breath. Nothing aggravates the symptoms. Nothing relieves the symptoms. She has tried nothing for the symptoms.    Past Medical History:  Diagnosis Date  . Allergy    seasonal  . Cough   . Diverticulitis   . Esophageal candidiasis ([place]) - treated 03/13/2016  . GERD (gastroesophageal reflux disease)   . IBS (irritable bowel syndrome)   . Kidney stones    right kidney  . Thyroid disease    Past Surgical History:  Procedure Laterality Date  . COLONOSCOPY    . FACIAL RECONSTRUCTION SURGERY    . HYSTEROTOMY    . lipoma right shoulder removal    . THYROIDECTOMY, PARTIAL    . TUBAL LIGATION    . UPPER GASTROINTESTINAL ENDOSCOPY     Family History  Problem Relation Age of Onset  . Heart disease Mother   . Diabetes Mother   . Kidney disease Father   . Breast cancer Sister   . Breast cancer Maternal Aunt   . Liver cancer Maternal Aunt   . Diabetes Maternal Aunt   . Liver cancer Paternal Uncle   . Breast cancer Maternal Grandmother   . Diabetes Maternal Grandmother   . Colon cancer Neg Hx   . Stomach cancer Neg Hx   . Esophageal cancer Neg Hx   . Pancreatic cancer Neg Hx   . Rectal cancer Neg Hx    Social  History  Substance Use Topics  . Smoking status: Former Smoker    Packs/day: 1.00    Years: 15.00    Types: Cigarettes    Quit date: 10/21/1975  . Smokeless tobacco: Never Used  . Alcohol use 1.2 oz/week    2 Glasses of wine per week     Comment: 1-2 glasses of wine a week   OB History    No data available     Review of Systems  Constitutional: Negative.   HENT: Negative.   Eyes: Negative.   Respiratory: Positive for cough and shortness of breath.   Cardiovascular: Negative.   Gastrointestinal:       GERD SX's  Endocrine: Negative.   Genitourinary: Negative.   Musculoskeletal: Negative.   Skin: Negative.   Allergic/Immunologic: Negative.   Neurological: Negative.   Hematological: Negative.   Psychiatric/Behavioral: Negative.     Allergies  Azithromycin; Codeine; Minocin [minocycline]; Peanut-containing drug products; Penicillins; Sulfa antibiotics; Sulfacetamide sodium; Iohexol; Latex; Other; and Pseudoephedrine-naproxen na er  Home Medications   Prior to Admission medications   Medication Sig Start Date End Date Taking? Authorizing Provider  aspirin 81 MG tablet Take 81 mg by mouth as needed for pain.    Historical Provider, MD  clindamycin (CLEOCIN) 150 MG capsule Take 1 capsule (150 mg total) by mouth  3 (three) times daily. 08/25/16   Lysbeth Penner, FNP  ESTRACE VAGINAL 0.1 MG/GM vaginal cream Place 1 Applicatorful vaginally 2 (two) times a week.  10/29/14   Historical Provider, MD  levothyroxine (SYNTHROID, LEVOTHROID) 88 MCG tablet Take 88 mcg by mouth daily before breakfast.    Historical Provider, MD  omeprazole (PRILOSEC) 40 MG capsule TAKE ONE CAPSULE TWICE A DAY TAKE 30-60 MINUTES BEFORE MORNING AND EVENING MEAL(MAX 1 /DAY PER INS) 12/06/15   Historical Provider, MD  ranitidine (ZANTAC) 300 MG tablet Take 1 tablet (300 mg total) by mouth at bedtime. 08/25/16   Lysbeth Penner, FNP   Meds Ordered and Administered this Visit   Medications  methylPREDNISolone  acetate (DEPO-MEDROL) injection 80 mg (80 mg Intramuscular Given 08/25/16 1805)    BP 128/80 (BP Location: Left Arm)   Pulse 65   Temp 97.7 F (36.5 C) (Oral)   Resp 17   Ht 5\' 2"  (1.575 m)   Wt 110 lb (49.9 kg)   SpO2 98%   BMI 20.12 kg/m  No data found.   Physical Exam  Constitutional: She is oriented to person, place, and time. She appears well-developed and well-nourished.  HENT:  Head: Normocephalic and atraumatic.  Right Ear: External ear normal.  Left Ear: External ear normal.  Mouth/Throat: Oropharynx is clear and moist.  Eyes: Conjunctivae and EOM are normal. Pupils are equal, round, and reactive to light.  Neck: Normal range of motion. Neck supple.  Cardiovascular: Normal rate, regular rhythm and normal heart sounds.   Pulmonary/Chest: Effort normal and breath sounds normal.  Abdominal: Soft. Bowel sounds are normal.  Neurological: She is alert and oriented to person, place, and time.  Nursing note and vitals reviewed.   Urgent Care Course   Clinical Course     Procedures (including critical care time)  Labs Review Labs Reviewed - No data to display  Imaging Review No results found.   Visual Acuity Review  Right Eye Distance:   Left Eye Distance:   Bilateral Distance:    Right Eye Near:   Left Eye Near:    Bilateral Near:         MDM   1. Cough   2. Bronchitis   3. Gastroesophageal reflux disease with esophagitis    Add Zantac 300mg  one po qhs #14 Depomedrol 80mg  IM Clindamycin 150mg  po tid x 7 days #21    Lysbeth Penner, FNP 08/25/16 1839

## 2016-10-07 DIAGNOSIS — Z01419 Encounter for gynecological examination (general) (routine) without abnormal findings: Secondary | ICD-10-CM | POA: Diagnosis not present

## 2016-10-07 DIAGNOSIS — N952 Postmenopausal atrophic vaginitis: Secondary | ICD-10-CM | POA: Diagnosis not present

## 2016-10-07 DIAGNOSIS — N905 Atrophy of vulva: Secondary | ICD-10-CM | POA: Diagnosis not present

## 2016-10-30 ENCOUNTER — Encounter (HOSPITAL_COMMUNITY): Payer: Self-pay | Admitting: Family Medicine

## 2016-10-30 ENCOUNTER — Ambulatory Visit (HOSPITAL_COMMUNITY)
Admission: EM | Admit: 2016-10-30 | Discharge: 2016-10-30 | Disposition: A | Discharge status: Home / Self Care | Payer: PPO | Attending: Emergency Medicine | Admitting: Emergency Medicine

## 2016-10-30 DIAGNOSIS — J029 Acute pharyngitis, unspecified: Secondary | ICD-10-CM | POA: Insufficient documentation

## 2016-10-30 DIAGNOSIS — Z803 Family history of malignant neoplasm of breast: Secondary | ICD-10-CM | POA: Insufficient documentation

## 2016-10-30 DIAGNOSIS — Z9889 Other specified postprocedural states: Secondary | ICD-10-CM | POA: Diagnosis not present

## 2016-10-30 DIAGNOSIS — Z87891 Personal history of nicotine dependence: Secondary | ICD-10-CM | POA: Diagnosis not present

## 2016-10-30 DIAGNOSIS — R1310 Dysphagia, unspecified: Secondary | ICD-10-CM | POA: Insufficient documentation

## 2016-10-30 DIAGNOSIS — Z888 Allergy status to other drugs, medicaments and biological substances status: Secondary | ICD-10-CM | POA: Diagnosis not present

## 2016-10-30 DIAGNOSIS — Z8249 Family history of ischemic heart disease and other diseases of the circulatory system: Secondary | ICD-10-CM | POA: Insufficient documentation

## 2016-10-30 DIAGNOSIS — Z880 Allergy status to penicillin: Secondary | ICD-10-CM | POA: Insufficient documentation

## 2016-10-30 DIAGNOSIS — R05 Cough: Secondary | ICD-10-CM | POA: Insufficient documentation

## 2016-10-30 DIAGNOSIS — Z79899 Other long term (current) drug therapy: Secondary | ICD-10-CM | POA: Insufficient documentation

## 2016-10-30 DIAGNOSIS — K219 Gastro-esophageal reflux disease without esophagitis: Secondary | ICD-10-CM | POA: Diagnosis not present

## 2016-10-30 DIAGNOSIS — E079 Disorder of thyroid, unspecified: Secondary | ICD-10-CM | POA: Diagnosis not present

## 2016-10-30 DIAGNOSIS — Z7982 Long term (current) use of aspirin: Secondary | ICD-10-CM | POA: Insufficient documentation

## 2016-10-30 DIAGNOSIS — Z800 Family history of malignant neoplasm of digestive organs: Secondary | ICD-10-CM | POA: Insufficient documentation

## 2016-10-30 DIAGNOSIS — K589 Irritable bowel syndrome without diarrhea: Secondary | ICD-10-CM | POA: Diagnosis not present

## 2016-10-30 DIAGNOSIS — Z833 Family history of diabetes mellitus: Secondary | ICD-10-CM | POA: Insufficient documentation

## 2016-10-30 DIAGNOSIS — R059 Cough, unspecified: Secondary | ICD-10-CM

## 2016-10-30 DIAGNOSIS — Z87442 Personal history of urinary calculi: Secondary | ICD-10-CM | POA: Insufficient documentation

## 2016-10-30 LAB — POCT RAPID STREP A: Streptococcus, Group A Screen (Direct): NEGATIVE

## 2016-10-30 MED ORDER — CLINDAMYCIN HCL 300 MG PO CAPS: 300.0000 mg | ORAL_CAPSULE | Freq: Three times a day (TID) | ORAL | 0 refills | Status: DC

## 2016-10-30 MED ORDER — DEXAMETHASONE SODIUM PHOSPHATE 10 MG/ML IJ SOLN
INTRAMUSCULAR | Status: AC
  Filled 2016-10-30: qty 1

## 2016-10-30 MED ORDER — PREDNISONE 10 MG (21) PO TBPK: ORAL_TABLET | ORAL | 0 refills | Status: DC

## 2016-10-30 MED ORDER — PREDNISONE 5 MG/ML PO CONC: 20.0000 mg | Freq: Two times a day (BID) | ORAL | 0 refills | Status: DC

## 2016-10-30 MED ORDER — DEXAMETHASONE SODIUM PHOSPHATE 10 MG/ML IJ SOLN
10.0000 mg | Freq: Once | INTRAMUSCULAR | Status: AC
  Administered 2016-10-30: 10 mg via INTRAMUSCULAR

## 2016-10-30 MED ORDER — BENZONATATE 100 MG PO CAPS: 100.0000 mg | ORAL_CAPSULE | Freq: Three times a day (TID) | ORAL | 0 refills | Status: DC

## 2016-10-30 NOTE — ED Triage Notes (Signed)
Pt here for URI symptoms x 2 days.  

## 2016-10-30 NOTE — ED Provider Notes (Signed)
CSN: [id number]     Arrival date & time 10/30/16  1647 History   First MD Initiated Contact with Patient 10/30/16 1740     Chief Complaint  Patient presents with  . URI   (Consider location/radiation/quality/duration/timing/severity/associated sxs/prior Treatment) 76 year old female presents to clinic with chief complaint of cough and sore throat for 24 hours, she has had some fever, denies shortness of breath, nausea, vomiting, or diarrhea, she has no congestion, no sinus pain or tenderness, no abdominal pain, or other symptoms. She reports she has a history of dysphagia and has to have occasional steroid therapy for her symptoms, has not been been treated in some time and her symptoms have worsened including increased difficulty swallowing and requests an injection.   The history is provided by the patient.  URI    Past Medical History:  Diagnosis Date  . Allergy    seasonal  . Cough   . Diverticulitis   . Esophageal candidiasis ([place]) - treated 03/13/2016  . GERD (gastroesophageal reflux disease)   . IBS (irritable bowel syndrome)   . Kidney stones    right kidney  . Thyroid disease    Past Surgical History:  Procedure Laterality Date  . COLONOSCOPY    . FACIAL RECONSTRUCTION SURGERY    . HYSTEROTOMY    . lipoma right shoulder removal    . THYROIDECTOMY, PARTIAL    . TUBAL LIGATION    . UPPER GASTROINTESTINAL ENDOSCOPY     Family History  Problem Relation Age of Onset  . Heart disease Mother   . Diabetes Mother   . Kidney disease Father   . Breast cancer Sister   . Breast cancer Maternal Aunt   . Liver cancer Maternal Aunt   . Diabetes Maternal Aunt   . Liver cancer Paternal Uncle   . Breast cancer Maternal Grandmother   . Diabetes Maternal Grandmother   . Colon cancer Neg Hx   . Stomach cancer Neg Hx   . Esophageal cancer Neg Hx   . Pancreatic cancer Neg Hx   . Rectal cancer Neg Hx    Social History  Substance Use Topics  . Smoking status: Former Smoker   Packs/day: 1.00    Years: 15.00    Types: Cigarettes    Quit date: 10/21/1975  . Smokeless tobacco: Never Used  . Alcohol use 1.2 oz/week    2 Glasses of wine per week     Comment: 1-2 glasses of wine a week   OB History    No data available     Review of Systems  Reason unable to perform ROS: as covered in HPI.  All other systems reviewed and are negative.   Allergies  Azithromycin; Codeine; Minocin [minocycline]; Peanut-containing drug products; Penicillins; Sulfa antibiotics; Sulfacetamide sodium; Iohexol; Latex; Other; and Pseudoephedrine-naproxen na er  Home Medications   Prior to Admission medications   Medication Sig Start Date End Date Taking? Authorizing Provider  aspirin 81 MG tablet Take 81 mg by mouth as needed for pain.    Historical Provider, MD  benzonatate (TESSALON) 100 MG capsule Take 1 capsule (100 mg total) by mouth every 8 (eight) hours. 10/30/16   Barnet Glasgow, NP  clindamycin (CLEOCIN) 150 MG capsule Take 1 capsule (150 mg total) by mouth 3 (three) times daily. 08/25/16   Lysbeth Penner, FNP  ESTRACE VAGINAL 0.1 MG/GM vaginal cream Place 1 Applicatorful vaginally 2 (two) times a week.  10/29/14   Historical Provider, MD  levothyroxine (SYNTHROID, [place])  88 MCG tablet Take 88 mcg by mouth daily before breakfast.    Historical Provider, MD  omeprazole (PRILOSEC) 40 MG capsule TAKE ONE CAPSULE TWICE A DAY TAKE 30-60 MINUTES BEFORE MORNING AND EVENING MEAL(MAX 1 /DAY PER INS) 12/06/15   Historical Provider, MD  predniSONE (PREDNISONE INTENSOL) 5 MG/ML concentrated solution Take 4 mLs (20 mg total) by mouth 2 (two) times daily with a meal. 10/30/16 11/06/16  Barnet Glasgow, NP  ranitidine (ZANTAC) 300 MG tablet Take 1 tablet (300 mg total) by mouth at bedtime. 08/25/16   Lysbeth Penner, FNP   Meds Ordered and Administered this Visit   Medications  dexamethasone (DECADRON) injection 10 mg (10 mg Intramuscular Given 10/30/16 1802)    BP (!) 125/52 (BP  Location: Left Arm) Comment: notified rn  Pulse 65   Temp 98 F (36.7 C) (Oral)   Resp 14   SpO2 100%  No data found.   Physical Exam  Constitutional: She is oriented to person, place, and time. She appears well-developed and well-nourished. No distress.  HENT:  Head: Normocephalic.  Right Ear: Tympanic membrane and external ear normal.  Left Ear: Tympanic membrane normal.  Nose: Nose normal. Right sinus exhibits no maxillary sinus tenderness and no frontal sinus tenderness. Left sinus exhibits no maxillary sinus tenderness and no frontal sinus tenderness.  Mouth/Throat: Mucous membranes are normal. Posterior oropharyngeal erythema present. No oropharyngeal exudate or posterior oropharyngeal edema. Tonsils are 0 on the right. Tonsils are 0 on the left.  Eyes: Pupils are equal, round, and reactive to light.  Neck: Normal range of motion. No JVD present.  Cardiovascular: Normal rate and regular rhythm.   Pulmonary/Chest: Effort normal and breath sounds normal.  Abdominal: Soft. Bowel sounds are normal.  Lymphadenopathy:    She has cervical adenopathy.  Neurological: She is alert and oriented to person, place, and time.  Skin: Skin is warm and dry. Capillary refill takes less than 2 seconds. She is not diaphoretic.  Psychiatric: She has a normal mood and affect.  Nursing note and vitals reviewed.   Urgent Care Course   Clinical Course     Procedures (including critical care time)  Labs Review Labs Reviewed  POCT RAPID STREP A    Imaging Review No results found.   Visual Acuity Review  Right Eye Distance:   Left Eye Distance:   Bilateral Distance:    Right Eye Near:   Left Eye Near:    Bilateral Near:         MDM   1. Cough   2. Sore throat   You have been tested for cough and sore throat tonight and have received an injection of dexamethasone in clinic for your sore throat. I have sent prescriptions to your pharmacy for [facility] for cough, take three times  a day, and for a prednisone, directions will be on the package. You may take tylenol ever 4 hours for fever and pain and chloraseptic throat lozenges as needed for pain.  Should your symptoms fail to resolve or worsen follow up with your primary care provider or return to clinic.Marland Kitchen Should your symptoms worsen or fail to improve follow up with your primary care provider or return to clinic.     Barnet Glasgow, NP 10/30/16 [phone]

## 2016-10-30 NOTE — Discharge Instructions (Signed)
You have been tested for cough and sore throat tonight and have received an injection of dexamethasone in clinic for your sore throat. I have sent prescriptions to your pharmacy for [facility] for cough, take three times a day, and for a prednisone, directions will be on the package. You may take tylenol ever 4 hours for fever and pain and chloraseptic throat lozenges as needed for pain.  Should your symptoms fail to resolve or worsen follow up with your primary care provider or return to clinic.Marland Kitchen Should your symptoms worsen or fail to improve follow up with your primary care provider or return to clinic.

## 2016-11-02 ENCOUNTER — Emergency Department (HOSPITAL_COMMUNITY): Payer: PPO

## 2016-11-02 ENCOUNTER — Encounter (HOSPITAL_COMMUNITY): Payer: Self-pay | Admitting: Emergency Medicine

## 2016-11-02 ENCOUNTER — Emergency Department (HOSPITAL_COMMUNITY)
Admission: EM | Admit: 2016-11-02 | Discharge: 2016-11-02 | Disposition: A | Discharge status: Home / Self Care | Payer: PPO | Attending: Emergency Medicine | Admitting: Emergency Medicine

## 2016-11-02 DIAGNOSIS — Z79899 Other long term (current) drug therapy: Secondary | ICD-10-CM | POA: Insufficient documentation

## 2016-11-02 DIAGNOSIS — R05 Cough: Secondary | ICD-10-CM | POA: Diagnosis not present

## 2016-11-02 DIAGNOSIS — J209 Acute bronchitis, unspecified: Secondary | ICD-10-CM | POA: Diagnosis not present

## 2016-11-02 DIAGNOSIS — R0602 Shortness of breath: Secondary | ICD-10-CM | POA: Diagnosis not present

## 2016-11-02 DIAGNOSIS — Z87891 Personal history of nicotine dependence: Secondary | ICD-10-CM | POA: Diagnosis not present

## 2016-11-02 LAB — CBC WITH DIFFERENTIAL/PLATELET
BASOS PCT: 0 %
Basophils Absolute: 0.1 10*3/uL (ref 0.0–0.1)
EOS ABS: 0.7 10*3/uL (ref 0.0–0.7)
Eosinophils Relative: 6 %
HCT: 40.3 % (ref 36.0–46.0)
HEMOGLOBIN: 13.8 g/dL (ref 12.0–15.0)
Lymphocytes Relative: 17 %
Lymphs Abs: 1.9 10*3/uL (ref 0.7–4.0)
MCH: 30.7 pg (ref 26.0–34.0)
MCHC: 34.2 g/dL (ref 30.0–36.0)
MCV: 89.6 fL (ref 78.0–100.0)
MONO ABS: 0.9 10*3/uL (ref 0.1–1.0)
MONOS PCT: 8 %
NEUTROS ABS: 8 10*3/uL — AB (ref 1.7–7.7)
NEUTROS PCT: 69 %
Platelets: 277 10*3/uL (ref 150–400)
RBC: 4.5 MIL/uL (ref 3.87–5.11)
RDW: 12.9 % (ref 11.5–15.5)
WBC: 11.6 10*3/uL — ABNORMAL HIGH (ref 4.0–10.5)

## 2016-11-02 LAB — CULTURE, GROUP A STREP (THRC)

## 2016-11-02 LAB — BASIC METABOLIC PANEL
Anion gap: 10 (ref 5–15)
BUN: 7 mg/dL (ref 6–20)
CO2: 26 mmol/L (ref 22–32)
CREATININE: 0.56 mg/dL (ref 0.44–1.00)
Calcium: 8.9 mg/dL (ref 8.9–10.3)
Chloride: 97 mmol/L — ABNORMAL LOW (ref 101–111)
GFR calc non Af Amer: >60 mL/min (ref >60)
Glucose, Bld: 98 mg/dL (ref 65–99)
Potassium: 3.7 mmol/L (ref 3.5–5.1)
SODIUM: 133 mmol/L — AB (ref 135–145)

## 2016-11-02 LAB — I-STAT TROPONIN, ED: TROPONIN I, POC: 0.01 ng/mL (ref 0.00–0.08)

## 2016-11-02 MED ORDER — ALBUTEROL SULFATE HFA 108 (90 BASE) MCG/ACT IN AERS
2.0000 | INHALATION_SPRAY | Freq: Once | RESPIRATORY_TRACT | Status: AC
  Administered 2016-11-02: 2 via RESPIRATORY_TRACT
  Filled 2016-11-02: qty 6.7

## 2016-11-02 MED ORDER — LEVALBUTEROL HCL 1.25 MG/0.5ML IN NEBU
1.2500 mg | INHALATION_SOLUTION | Freq: Once | RESPIRATORY_TRACT | Status: AC
  Administered 2016-11-02: 1.25 mg via RESPIRATORY_TRACT
  Filled 2016-11-02: qty 0.5

## 2016-11-02 MED ORDER — SODIUM CHLORIDE 0.9 % IV BOLUS (SEPSIS)
500.0000 mL | Freq: Once | INTRAVENOUS | Status: AC
  Administered 2016-11-02: 500 mL via INTRAVENOUS

## 2016-11-02 NOTE — ED Notes (Signed)
Pt stable, understands discharge instructions, and reasons for return.   

## 2016-11-02 NOTE — ED Notes (Signed)
Pt ambulated in hallway o2 went to 92%.

## 2016-11-02 NOTE — ED Provider Notes (Signed)
[facility] DEPT Provider Note   CSN: [id number] Arrival date & time: 11/02/16  1122     History   Chief Complaint Chief Complaint  Patient presents with  . Wheezing  . Foreign Body    possible food stuck in throat  . Shortness of Breath    HPI Anita [name] is a 76 y.o. female.  HPI  76 year old female presents with a cc of dyspnea. Started 2 days ago. 4 days ago she had a recurrent episode of food regurgitating and came out of her nose. Has a 1.5+ year history of esophagitis/reflux. This is similar to prior. She does not think she aspirated. Has not had food over past 2 days but is clearly swallowing fluids. Has a cough with brown sputum with dyspnea. No fevers. States she's had lung problems in past that is from the GI issues. No structural lung disease per her. Chest feels tight but not painful. No headache. Does have sore throat and congestion.  Has never had albuterol before but was told she couldn't take it because she might be allergic due to another allergy (does not know why).  Past Medical History:  Diagnosis Date  . Allergy    seasonal  . Cough   . Diverticulitis   . Esophageal candidiasis ([place]) - treated 03/13/2016  . GERD (gastroesophageal reflux disease)   . IBS (irritable bowel syndrome)   . Kidney stones    right kidney  . Thyroid disease     Patient Active Problem List   Diagnosis Date Noted  . Esophageal candidiasis ([place]) - treated 03/13/2016  . Dysphagia, pharyngoesophageal phase 09/20/2015  . Upper airway cough syndrome 09/20/2015    Past Surgical History:  Procedure Laterality Date  . COLONOSCOPY    . FACIAL RECONSTRUCTION SURGERY    . HYSTEROTOMY    . lipoma right shoulder removal    . THYROIDECTOMY, PARTIAL    . TUBAL LIGATION    . UPPER GASTROINTESTINAL ENDOSCOPY      OB History    No data available       Home Medications    Prior to Admission medications   Medication Sig Start Date End Date Taking? Authorizing Provider    levothyroxine (SYNTHROID, LEVOTHROID) 88 MCG tablet Take 88 mcg by mouth daily before breakfast.   Yes Historical Provider, MD  omeprazole (PRILOSEC) 40 MG capsule Take 40 mg by mouth daily. 10/06/16  Yes Historical Provider, MD  benzonatate (TESSALON) 100 MG capsule Take 1 capsule (100 mg total) by mouth every 8 (eight) hours. Patient not taking: Reported on 11/02/2016 10/30/16   Barnet Glasgow, NP  ranitidine (ZANTAC) 300 MG tablet Take 1 tablet (300 mg total) by mouth at bedtime. Patient not taking: Reported on 11/02/2016 08/25/16   Lysbeth Penner, FNP    Family History Family History  Problem Relation Age of Onset  . Heart disease Mother   . Diabetes Mother   . Kidney disease Father   . Breast cancer Sister   . Breast cancer Maternal Aunt   . Liver cancer Maternal Aunt   . Diabetes Maternal Aunt   . Liver cancer Paternal Uncle   . Breast cancer Maternal Grandmother   . Diabetes Maternal Grandmother   . Colon cancer Neg Hx   . Stomach cancer Neg Hx   . Esophageal cancer Neg Hx   . Pancreatic cancer Neg Hx   . Rectal cancer Neg Hx     Social History Social History  Substance Use Topics  .  Smoking status: Former Smoker    Packs/day: 1.00    Years: 15.00    Types: Cigarettes    Quit date: 10/21/1975  . Smokeless tobacco: Never Used  . Alcohol use 1.2 oz/week    2 Glasses of wine per week     Comment: 1-2 glasses of wine a week     Allergies   Azithromycin; Codeine; Minocin [minocycline]; Peanut-containing drug products; Penicillins; Sulfa antibiotics; Sulfacetamide sodium; Iohexol; Latex; and Pseudoephedrine-naproxen na er   Review of Systems Review of Systems  Constitutional: Negative for fever.  HENT: Positive for congestion and sore throat.   Respiratory: Positive for cough and shortness of breath.   Cardiovascular: Positive for chest pain.  Gastrointestinal: Negative for abdominal pain and vomiting.  All other systems reviewed and are  negative.    Physical Exam Updated Vital Signs BP 127/62   Pulse 71   Temp 98 F (36.7 C)   Resp 19   SpO2 97%   Physical Exam  Constitutional: She is oriented to person, place, and time. She appears well-developed and well-nourished. No distress.  HENT:  Head: Normocephalic and atraumatic.  Right Ear: External ear normal.  Left Ear: External ear normal.  Nose: Nose normal.  Eyes: Right eye exhibits no discharge. Left eye exhibits no discharge.  Cardiovascular: Normal rate, regular rhythm and normal heart sounds.   Pulmonary/Chest: No accessory muscle usage. Tachypnea noted. She has wheezes (diffuse, expiratory).  Abdominal: Soft. There is no tenderness.  Neurological: She is alert and oriented to person, place, and time.  Skin: Skin is warm and dry. She is not diaphoretic.  Nursing note and vitals reviewed.    ED Treatments / Results  Labs (all labs ordered are listed, but only abnormal results are displayed) Labs Reviewed  CBC WITH DIFFERENTIAL/PLATELET - Abnormal; Notable for the following:       Result Value   WBC 11.6 (*)    Neutro Abs 8.0 (*)    All other components within normal limits  BASIC METABOLIC PANEL - Abnormal; Notable for the following:    Sodium 133 (*)    Chloride 97 (*)    All other components within normal limits  I-STAT TROPOININ, ED    EKG  EKG Interpretation  Date/Time:  Sunday November 02 2016 11:37:20 EST Ventricular Rate:  75 PR Interval:  130 QRS Duration: 66 QT Interval:  386 QTC Calculation: 431 R Axis:   22 Text Interpretation:  Normal sinus rhythm Right atrial enlargement Borderline ECG nonspecific St/T segments no significant change since April 2017 Confirmed by Regenia Skeeter MD, Kittie Krizan [phone]) on 11/02/2016 12:17:41 PM       Radiology Dg Chest 2 View  Result Date: 11/02/2016 CLINICAL DATA:  Productive cough over the last 4 days. Significant smoking history. EXAM: CHEST  2 VIEW COMPARISON:  Two-view chest x-ray 05/23/2016  FINDINGS: Heart size is normal. Aortic atherosclerosis is present. Emphysematous changes are present. There is no edema or effusion. No focal airspace disease, nodule, or mass lesion is present. Mild degenerative changes in the thoracic spine are stable. IMPRESSION: 1. No acute cardiopulmonary disease. 2. Aortic atherosclerosis. 3. Emphysema. Electronically Signed   By: San Morelle M.D.   On: 11/02/2016 12:38    Procedures Procedures (including critical care time)  Medications Ordered in ED Medications  sodium chloride 0.9 % bolus 500 mL (0 mLs Intravenous Stopped 11/02/16 1450)  levalbuterol (XOPENEX) nebulizer solution 1.25 mg (1.25 mg Nebulization Given 11/02/16 1333)  albuterol (PROVENTIL HFA;VENTOLIN HFA) 108 (90 Base)  MCG/ACT inhaler 2 puff (2 puffs Inhalation Given 11/02/16 1448)     Initial Impression / Assessment and Plan / ED Course  I have reviewed the triage vital signs and the nursing notes.  Pertinent labs & imaging results that were available during my care of the patient were reviewed by me and considered in my medical decision making (see chart for details).  Clinical Course as of Nov 03 1843  Anita [name] Nov 02, 2016  1320 Discussed with pharmacist about her potential pseudophed -albuterol interaction/allergy/cross-reactivity. Pharmacy thinks it should not cause a true reaction. But will try levo-albuterol. Likely flagging in computer bc both are sympathomimetics. She has never had a breathing treatment before per her.  [SG]    Clinical Course User Index [SG] Sherwood Gambler, MD    After a dose of xopenex her dyspnea has resolved. Clear lungs. Transiently dropped sats (without symptoms) into mid-80s. After observation her sats are now normal and she ambulated with no hypoxia. Feels no dyspnea. Likely bronchitis. No signs of bacterial infection. Is already on steroids. Given albuterol inhaler and tolerated. Advised to use q4 prn. As for the food, this appears to be a chronic  issue and she tells me she is not concerned about impaction and is swallowing. F/u with pcp, discussed return precautions.  Final Clinical Impressions(s) / ED Diagnoses   Final diagnoses:  Acute bronchitis, unspecified organism    New Prescriptions Discharge Medication List as of 11/02/2016  3:44 PM       Sherwood Gambler, MD 11/02/16 [phone]

## 2016-11-02 NOTE — ED Notes (Signed)
Pt ambulated to bathroom independently on RA. Pt reports she felt comfortable ambulating and denies pain/SOB. Pt O2 sats 88% on RA when she returned to room. MD aware.

## 2016-11-02 NOTE — ED Triage Notes (Signed)
Pt c/o possible food stuck in throat and wheezing. Pt seen at urgent care for same and was told she had fluid in her lungs.

## 2016-11-02 NOTE — ED Notes (Signed)
Pt O2 sat at 85% on [place]. Placed pt on 2L [place].

## 2016-11-13 DIAGNOSIS — Z1231 Encounter for screening mammogram for malignant neoplasm of breast: Secondary | ICD-10-CM | POA: Diagnosis not present

## 2016-11-13 DIAGNOSIS — M8589 Other specified disorders of bone density and structure, multiple sites: Secondary | ICD-10-CM | POA: Diagnosis not present

## 2016-11-13 DIAGNOSIS — Z803 Family history of malignant neoplasm of breast: Secondary | ICD-10-CM | POA: Diagnosis not present

## 2016-11-27 DIAGNOSIS — L723 Sebaceous cyst: Secondary | ICD-10-CM | POA: Diagnosis not present

## 2016-12-27 ENCOUNTER — Emergency Department (HOSPITAL_BASED_OUTPATIENT_CLINIC_OR_DEPARTMENT_OTHER): Payer: PPO

## 2016-12-27 ENCOUNTER — Emergency Department (HOSPITAL_BASED_OUTPATIENT_CLINIC_OR_DEPARTMENT_OTHER)
Admission: EM | Admit: 2016-12-27 | Discharge: 2016-12-27 | Disposition: A | Discharge status: Home / Self Care | Payer: PPO | Attending: Emergency Medicine | Admitting: Emergency Medicine

## 2016-12-27 ENCOUNTER — Encounter (HOSPITAL_BASED_OUTPATIENT_CLINIC_OR_DEPARTMENT_OTHER): Payer: Self-pay | Admitting: Emergency Medicine

## 2016-12-27 DIAGNOSIS — Z0389 Encounter for observation for other suspected diseases and conditions ruled out: Secondary | ICD-10-CM | POA: Diagnosis not present

## 2016-12-27 DIAGNOSIS — Z87891 Personal history of nicotine dependence: Secondary | ICD-10-CM | POA: Insufficient documentation

## 2016-12-27 DIAGNOSIS — J40 Bronchitis, not specified as acute or chronic: Secondary | ICD-10-CM | POA: Diagnosis not present

## 2016-12-27 DIAGNOSIS — R05 Cough: Secondary | ICD-10-CM | POA: Diagnosis not present

## 2016-12-27 MED ORDER — DEXAMETHASONE SODIUM PHOSPHATE 10 MG/ML IJ SOLN
10.0000 mg | Freq: Once | INTRAMUSCULAR | Status: AC
  Administered 2016-12-27: 10 mg via INTRAMUSCULAR
  Filled 2016-12-27: qty 1

## 2016-12-27 NOTE — ED Notes (Signed)
PT reports she has problems with her esophagus and that from time to time food gets stuck in her esophagus and she gets bronchitis and get a shot of prednisone. Pt is being seen at [facility] in [place] by GI for this problem and has had her esophagus stretched a few times in the past.

## 2016-12-27 NOTE — ED Provider Notes (Signed)
[place] DEPT [facility] Provider Note   CSN: [id number] Arrival date & time: 12/27/16  1455   By signing my name below, I, Anita [name], attest that this documentation has been prepared under the direction and in the presence of Malvin Johns, MD. Electronically signed, Anita [name], ED Scribe. 12/27/16. 4:29 PM.   History   Chief Complaint Chief Complaint  Patient presents with  . Cough  . Nasal Congestion   The history is provided by the patient and medical records. No language interpreter was used.    HPI Comments: Anita [name] is a 76 y.o. female with Hx of dysphagia and bronchitis who presents to the Emergency Department complaining of worsened, chronic throat discomfort x 2 months. She notes associated nausea, congestion, sinus pressure, wheezing, occasional difficulty swallowing and a burning sensation between the chest and throat. She states she has eaten broths, baked potatoes and lighter foods over the past week because of aforementioned difficulty swallowing. She states she was seen for similar symptoms 11/02/2016 at [facility] ED treated with prednisone and a breathing treatment. She notes the issue has been ongoing for ~1 year. Hx of IBS noted; pt states she has had 3 bowel movements today. Pt denies abdominal pain and constipation. She states that usually when her dysphagia is worse, her bronchitis is worse and she needs a steroid shot.      Past Medical History:  Diagnosis Date  . Allergy    seasonal  . Cough   . Diverticulitis   . Esophageal candidiasis ([place]) - treated 03/13/2016  . GERD (gastroesophageal reflux disease)   . IBS (irritable bowel syndrome)   . Kidney stones    right kidney  . Thyroid disease     Patient Active Problem List   Diagnosis Date Noted  . Esophageal candidiasis ([place]) - treated 03/13/2016  . Dysphagia, pharyngoesophageal phase 09/20/2015  . Upper airway cough syndrome 09/20/2015    Past Surgical History:  Procedure Laterality Date    . COLONOSCOPY    . FACIAL RECONSTRUCTION SURGERY    . HYSTEROTOMY    . lipoma right shoulder removal    . THYROIDECTOMY, PARTIAL    . TUBAL LIGATION    . UPPER GASTROINTESTINAL ENDOSCOPY      OB History    No data available       Home Medications    Prior to Admission medications   Medication Sig Start Date End Date Taking? Authorizing Provider  benzonatate (TESSALON) 100 MG capsule Take 1 capsule (100 mg total) by mouth every 8 (eight) hours. Patient not taking: Reported on 11/02/2016 10/30/16   Barnet Glasgow, NP  levothyroxine (SYNTHROID, LEVOTHROID) 88 MCG tablet Take 88 mcg by mouth daily before breakfast.    Historical Provider, MD  omeprazole (PRILOSEC) 40 MG capsule Take 40 mg by mouth daily. 10/06/16   Historical Provider, MD  ranitidine (ZANTAC) 300 MG tablet Take 1 tablet (300 mg total) by mouth at bedtime. Patient not taking: Reported on 11/02/2016 08/25/16   Lysbeth Penner, FNP    Family History Family History  Problem Relation Age of Onset  . Heart disease Mother   . Diabetes Mother   . Kidney disease Father   . Breast cancer Sister   . Breast cancer Maternal Aunt   . Liver cancer Maternal Aunt   . Diabetes Maternal Aunt   . Liver cancer Paternal Uncle   . Breast cancer Maternal Grandmother   . Diabetes Maternal Grandmother   . Colon cancer Neg Hx   .  Stomach cancer Neg Hx   . Esophageal cancer Neg Hx   . Pancreatic cancer Neg Hx   . Rectal cancer Neg Hx     Social History Social History  Substance Use Topics  . Smoking status: Former Smoker    Packs/day: 1.00    Years: 15.00    Types: Cigarettes    Quit date: 10/21/1975  . Smokeless tobacco: Never Used  . Alcohol use 1.2 oz/week    2 Glasses of wine per week     Comment: 1-2 glasses of wine a week     Allergies   Azithromycin; Codeine; Minocin [minocycline]; Peanut-containing drug products; Penicillins; Sulfa antibiotics; Sulfacetamide sodium; Iohexol; Latex; and Pseudoephedrine-naproxen  na er   Review of Systems Review of Systems  Constitutional: Negative for chills, diaphoresis, fatigue and fever.  HENT: Positive for congestion, rhinorrhea and trouble swallowing. Negative for sneezing.   Eyes: Negative.   Respiratory: Positive for cough and wheezing. Negative for chest tightness and shortness of breath.   Cardiovascular: Negative for chest pain and leg swelling.  Gastrointestinal: Negative for abdominal pain, blood in stool, diarrhea, nausea and vomiting.  Genitourinary: Negative for difficulty urinating, flank pain, frequency and hematuria.  Musculoskeletal: Negative for arthralgias and back pain.  Skin: Negative for rash.  Neurological: Negative for dizziness, speech difficulty, weakness, numbness and headaches.     Physical Exam Updated Vital Signs BP 121/67   Pulse 72   Temp 98.2 F (36.8 C) (Oral)   Resp 18   Ht 5\' 2"  (1.575 m)   Wt 112 lb (50.8 kg)   SpO2 98%   BMI 20.49 kg/m   Physical Exam  Constitutional: She is oriented to person, place, and time. She appears well-developed and well-nourished.  HENT:  Head: Normocephalic and atraumatic.  Mouth/Throat: Oropharynx is clear and moist.  Eyes: Pupils are equal, round, and reactive to light.  Neck: Normal range of motion. Neck supple.  Cardiovascular: Normal rate, regular rhythm and normal heart sounds.   Pulmonary/Chest: Effort normal and breath sounds normal. No respiratory distress. She has no wheezes. She has no rales. She exhibits no tenderness.  Abdominal: Soft. Bowel sounds are normal. There is no tenderness. There is no rebound and no guarding.  Musculoskeletal: Normal range of motion. She exhibits no edema.  Lymphadenopathy:    She has no cervical adenopathy.  Neurological: She is alert and oriented to person, place, and time.  Skin: Skin is warm and dry. No rash noted.  Psychiatric: She has a normal mood and affect.     ED Treatments / Results  DIAGNOSTIC STUDIES: Oxygen Saturation  is 98% on RA, normal by my interpretation.    COORDINATION OF CARE: 4:29 PM Discussed treatment plan with pt at bedside and pt agreed to plan. Will order medications and imaging.  Labs (all labs ordered are listed, but only abnormal results are displayed) Labs Reviewed - No data to display  EKG  EKG Interpretation  Date/Time:  Saturday December 27 2016 16:42:46 EST Ventricular Rate:  68 PR Interval:    QRS Duration: 91 QT Interval:  436 QTC Calculation: 464 R Axis:   58 Text Interpretation:  Sinus rhythm Borderline low voltage, extremity leads since last tracing no significant change Confirmed by Haidyn Kilburg  MD, Madysyn Hanken ([id number]) on 12/27/2016 5:08:41 PM       Radiology Dg Chest 2 View  Result Date: 12/27/2016 CLINICAL DATA:  Cough, nausea EXAM: CHEST  2 VIEW COMPARISON:  11/02/2016 FINDINGS: Chronic interstitial markings. No focal consolidation.  No pleural effusion or pneumothorax. The heart is normal in size. Visualized osseous structures are within normal limits. Surgical clips overlying the right neck. Thoracic aortic atherosclerosis. IMPRESSION: No evidence of acute cardiopulmonary disease. Thoracic aortic atherosclerosis. Electronically Signed   By: Julian Hy M.D.   On: 12/27/2016 16:50    Procedures Procedures (including critical care time)  Medications Ordered in ED Medications  dexamethasone (DECADRON) injection 10 mg (10 mg Intramuscular Given 12/27/16 1646)     Initial Impression / Assessment and Plan / ED Course  I have reviewed the triage vital signs and the nursing notes.  Pertinent labs & imaging results that were available during my care of the patient were reviewed by me and considered in my medical decision making (see chart for details).   patient presents with cough and occasional wheezing which she attributes to her dysphasia. She was given a shot of Decadron here. Her lungs are clear and the ED. She is in no distress. No hypoxia. She is able to swallow  soft foods and liquids. She was discharged home in good condition. She will follow-up with her PCP as needed. Return precautions given.  I personally performed the services described in this documentation, which was scribed in my presence.  The recorded information has been reviewed and considered.   Final Clinical Impressions(s) / ED Diagnoses   Final diagnoses:  Bronchitis    New Prescriptions New Prescriptions   No medications on file     Malvin Johns, MD 12/27/16 1715

## 2016-12-27 NOTE — ED Triage Notes (Signed)
Pt states cough and congestion for about 1 week

## 2016-12-27 NOTE — ED Notes (Signed)
Pt discharged to home with family. NAD.  

## 2017-01-07 DIAGNOSIS — K210 Gastro-esophageal reflux disease with esophagitis: Secondary | ICD-10-CM | POA: Diagnosis not present

## 2017-01-07 DIAGNOSIS — E038 Other specified hypothyroidism: Secondary | ICD-10-CM | POA: Diagnosis not present

## 2017-01-07 DIAGNOSIS — R1314 Dysphagia, pharyngoesophageal phase: Secondary | ICD-10-CM | POA: Diagnosis not present

## 2017-01-13 ENCOUNTER — Ambulatory Visit (HOSPITAL_COMMUNITY)
Admission: EM | Admit: 2017-01-13 | Discharge: 2017-01-13 | Disposition: A | Discharge status: Home / Self Care | Payer: PPO | Attending: Internal Medicine | Admitting: Internal Medicine

## 2017-01-13 ENCOUNTER — Encounter (HOSPITAL_COMMUNITY): Payer: Self-pay | Admitting: Emergency Medicine

## 2017-01-13 DIAGNOSIS — R059 Cough, unspecified: Secondary | ICD-10-CM

## 2017-01-13 DIAGNOSIS — R062 Wheezing: Secondary | ICD-10-CM

## 2017-01-13 DIAGNOSIS — R0602 Shortness of breath: Secondary | ICD-10-CM

## 2017-01-13 DIAGNOSIS — R05 Cough: Secondary | ICD-10-CM

## 2017-01-13 MED ORDER — SPACER/AERO CHAMBER MOUTHPIECE MISC: 1.0000 | 0 refills | Status: DC

## 2017-01-13 MED ORDER — LEVALBUTEROL TARTRATE 45 MCG/ACT IN AERO: 1.0000 | INHALATION_SPRAY | RESPIRATORY_TRACT | 1 refills | Status: AC | PRN

## 2017-01-13 MED ORDER — PREDNISOLONE 15 MG/5ML PO SYRP: ORAL_SOLUTION | ORAL | 0 refills | Status: DC

## 2017-01-13 NOTE — ED Provider Notes (Signed)
CSN: [id number]     Arrival date & time 01/13/17  1447 History   First MD Initiated Contact with Patient 01/13/17 1528     Chief Complaint  Patient presents with  . Cough  . Shortness of Breath   (Consider location/radiation/quality/duration/timing/severity/associated sxs/prior Treatment) Patient states she had a lot of wheezing and SOB last night and she is having asthma sx's.   The history is provided by the patient.  Cough  Cough characteristics:  Non-productive Severity:  Moderate Onset quality:  Sudden Duration:  2 days Timing:  Constant Progression:  Worsening Chronicity:  New Smoker: no   Relieved by:  Nothing Worsened by:  Nothing Ineffective treatments:  None tried Associated symptoms: shortness of breath   Shortness of Breath  Associated symptoms: cough     Past Medical History:  Diagnosis Date  . Allergy    seasonal  . Cough   . Diverticulitis   . Esophageal candidiasis ([place]) - treated 03/13/2016  . GERD (gastroesophageal reflux disease)   . IBS (irritable bowel syndrome)   . Kidney stones    right kidney  . Thyroid disease    Past Surgical History:  Procedure Laterality Date  . COLONOSCOPY    . FACIAL RECONSTRUCTION SURGERY    . HYSTEROTOMY    . lipoma right shoulder removal    . THYROIDECTOMY, PARTIAL    . TUBAL LIGATION    . UPPER GASTROINTESTINAL ENDOSCOPY     Family History  Problem Relation Age of Onset  . Heart disease Mother   . Diabetes Mother   . Kidney disease Father   . Breast cancer Sister   . Breast cancer Maternal Aunt   . Liver cancer Maternal Aunt   . Diabetes Maternal Aunt   . Liver cancer Paternal Uncle   . Breast cancer Maternal Grandmother   . Diabetes Maternal Grandmother   . Colon cancer Neg Hx   . Stomach cancer Neg Hx   . Esophageal cancer Neg Hx   . Pancreatic cancer Neg Hx   . Rectal cancer Neg Hx    Social History  Substance Use Topics  . Smoking status: Former Smoker    Packs/day: 1.00    Years: 15.00     Types: Cigarettes    Quit date: 10/21/1975  . Smokeless tobacco: Never Used  . Alcohol use 1.2 oz/week    2 Glasses of wine per week     Comment: 1-2 glasses of wine a week   OB History    No data available     Review of Systems  Constitutional: Negative.   HENT: Negative.   Eyes: Negative.   Respiratory: Positive for cough and shortness of breath.   Cardiovascular: Negative.   Gastrointestinal: Negative.   Endocrine: Negative.   Genitourinary: Negative.   Musculoskeletal: Negative.   Allergic/Immunologic: Negative.   Neurological: Negative.   Hematological: Negative.   Psychiatric/Behavioral: Negative.     Allergies  Azithromycin; Codeine; Minocin [minocycline]; Peanut-containing drug products; Penicillins; Sulfa antibiotics; Sulfacetamide sodium; Iohexol; Latex; and Pseudoephedrine-naproxen na er  Home Medications   Prior to Admission medications   Medication Sig Start Date End Date Taking? Authorizing Provider  benzonatate (TESSALON) 100 MG capsule Take 1 capsule (100 mg total) by mouth every 8 (eight) hours. Patient not taking: Reported on 11/02/2016 10/30/16   Barnet Glasgow, NP  levalbuterol [facility] HFA) 45 MCG/ACT inhaler Inhale 1-2 puffs into the lungs every 4 (four) hours as needed for wheezing. 01/13/17   Lysbeth Penner, FNP  levothyroxine (SYNTHROID, LEVOTHROID) 88 MCG tablet Take 88 mcg by mouth daily before breakfast.    Historical Provider, MD  omeprazole (PRILOSEC) 40 MG capsule Take 40 mg by mouth daily. 10/06/16   Historical Provider, MD  prednisoLONE (PRELONE) 15 MG/5ML syrup Take 21ml po bid x 2 days then 7.5 ml po qd x 2 days then 73ml po qd x 4 days 01/13/17   Lysbeth Penner, FNP  ranitidine (ZANTAC) 300 MG tablet Take 1 tablet (300 mg total) by mouth at bedtime. Patient not taking: Reported on 11/02/2016 08/25/16   Lysbeth Penner, FNP  Spacer/Aero Chamber Mouthpiece MISC 1 Device by Does not apply route as directed. 01/13/17   Lysbeth Penner, FNP    Meds Ordered and Administered this Visit  Medications - No data to display  BP 138/70   Pulse 80   Temp 98.1 F (36.7 C)   Resp 16   Ht 5\' 2"  (1.575 m)   Wt 110 lb (49.9 kg)   SpO2 95%   BMI 20.12 kg/m  No data found.   Physical Exam  Constitutional: She appears well-developed and well-nourished.  HENT:  Head: Normocephalic and atraumatic.  Right Ear: External ear normal.  Left Ear: External ear normal.  Mouth/Throat: Oropharynx is clear and moist.  Eyes: Conjunctivae and EOM are normal. Pupils are equal, round, and reactive to light.  Neck: Normal range of motion. Neck supple.  Cardiovascular: Normal rate, regular rhythm and normal heart sounds.   Pulmonary/Chest: Effort normal and breath sounds normal.  Abdominal: Soft. Bowel sounds are normal.  Nursing note and vitals reviewed.   Urgent Care Course     Procedures (including critical care time)  Labs Review Labs Reviewed - No data to display  Imaging Review No results found.   Visual Acuity Review  Right Eye Distance:   Left Eye Distance:   Bilateral Distance:    Right Eye Near:   Left Eye Near:    Bilateral Near:         MDM   1. Cough   2. Shortness of breath   3. Wheezing    Prelone cough syrup 15mg /57ml one tsp po bid x 2 days then 7.21ml po qd x 2 days then 5 ml po qd x 4 days Xoponex [facility] [place], FNP 01/13/17 1646

## 2017-01-13 NOTE — ED Triage Notes (Signed)
PT reports history of dysphagia, and cough related to esophageal problems. PT was seen in ED in January for SOB and was discharged. PT had a decadron shot 3/10. PT tried an albuterol inhaler last night and got worse.

## 2017-01-27 DIAGNOSIS — N907 Vulvar cyst: Secondary | ICD-10-CM | POA: Diagnosis not present

## 2017-01-27 DIAGNOSIS — L720 Epidermal cyst: Secondary | ICD-10-CM | POA: Diagnosis not present

## 2017-01-30 ENCOUNTER — Emergency Department (HOSPITAL_BASED_OUTPATIENT_CLINIC_OR_DEPARTMENT_OTHER): Payer: PPO

## 2017-01-30 ENCOUNTER — Other Ambulatory Visit: Payer: Self-pay

## 2017-01-30 ENCOUNTER — Emergency Department (HOSPITAL_BASED_OUTPATIENT_CLINIC_OR_DEPARTMENT_OTHER)
Admission: EM | Admit: 2017-01-30 | Discharge: 2017-01-30 | Disposition: A | Discharge status: Home / Self Care | Payer: PPO | Attending: Emergency Medicine | Admitting: Emergency Medicine

## 2017-01-30 ENCOUNTER — Encounter (HOSPITAL_BASED_OUTPATIENT_CLINIC_OR_DEPARTMENT_OTHER): Payer: Self-pay | Admitting: Emergency Medicine

## 2017-01-30 DIAGNOSIS — R1310 Dysphagia, unspecified: Secondary | ICD-10-CM | POA: Diagnosis not present

## 2017-01-30 DIAGNOSIS — Z87891 Personal history of nicotine dependence: Secondary | ICD-10-CM | POA: Insufficient documentation

## 2017-01-30 DIAGNOSIS — J40 Bronchitis, not specified as acute or chronic: Secondary | ICD-10-CM | POA: Diagnosis not present

## 2017-01-30 DIAGNOSIS — R0602 Shortness of breath: Secondary | ICD-10-CM | POA: Diagnosis not present

## 2017-01-30 DIAGNOSIS — R05 Cough: Secondary | ICD-10-CM | POA: Diagnosis not present

## 2017-01-30 HISTORY — DX: Esophageal obstruction: K22.2

## 2017-01-30 MED ORDER — LEVALBUTEROL HCL 0.63 MG/3ML IN NEBU
INHALATION_SOLUTION | RESPIRATORY_TRACT | Status: AC
  Administered 2017-01-30: 0.63 mg
  Filled 2017-01-30: qty 3

## 2017-01-30 MED ORDER — METHYLPREDNISOLONE SODIUM SUCC 125 MG IJ SOLR
125.0000 mg | Freq: Once | INTRAMUSCULAR | Status: AC
Start: 2017-01-30 — End: 2017-01-30
  Administered 2017-01-30: 125 mg via INTRAMUSCULAR
  Filled 2017-01-30: qty 2

## 2017-01-30 MED ORDER — ALBUTEROL SULFATE (2.5 MG/3ML) 0.083% IN NEBU: 2.5000 mg | INHALATION_SOLUTION | Freq: Once | RESPIRATORY_TRACT | Status: DC

## 2017-01-30 NOTE — ED Triage Notes (Signed)
Pt states she had cyst removed on Tuesday, laid flat for a while and started feeling sob.  Pt seen for same on 01/13/17.  Pt states she has to have a shot every 6 weeks.  Pt speaking sentences.  Some esophageal wheezing.  Lungs diminished.

## 2017-01-30 NOTE — ED Provider Notes (Signed)
[place] DEPT [facility] Provider Note   CSN: [id number] Arrival date & time: 01/30/17  1107     History   Chief Complaint Chief Complaint  Patient presents with  . Shortness of Breath    HPI Anita [name] is a 76 y.o. female.  HPI  During procedure on Tuesday had coughing, then felt better, then Wednesday developed worsening coughing and wheezing.  Took some liquid tylenol and did not feel like it went down well, felt like didn't agree with throat.  Wednesday AM.  Terrance Mass, but still having shortness of breath and wheezing.  Did not use anything this morning.  Does not feel like food is going down. Swallowing liquids ok. This is chronic intermittent problems.  Sees ENT/GI has seen pulmonology.  Productive of some white sputum. No chest pain, no fevers.   Past Medical History:  Diagnosis Date  . Allergy    seasonal  . Cough   . Diverticulitis   . Esophageal candidiasis ([place]) - treated 03/13/2016  . Esophageal stricture   . GERD (gastroesophageal reflux disease)   . IBS (irritable bowel syndrome)   . Kidney stones    right kidney  . Thyroid disease     Patient Active Problem List   Diagnosis Date Noted  . Esophageal candidiasis ([place]) - treated 03/13/2016  . Dysphagia, pharyngoesophageal phase 09/20/2015  . Upper airway cough syndrome 09/20/2015    Past Surgical History:  Procedure Laterality Date  . COLONOSCOPY    . ESOPHAGEAL DILATION    . FACIAL RECONSTRUCTION SURGERY    . HYSTEROTOMY    . lipoma right shoulder removal    . THYROIDECTOMY, PARTIAL    . TUBAL LIGATION    . UPPER GASTROINTESTINAL ENDOSCOPY      OB History    No data available       Home Medications    Prior to Admission medications   Medication Sig Start Date End Date Taking? Authorizing Provider  benzonatate (TESSALON) 100 MG capsule Take 1 capsule (100 mg total) by mouth every 8 (eight) hours. Patient not taking: Reported on 11/02/2016 10/30/16   Barnet Glasgow, NP    levalbuterol [facility] HFA) 45 MCG/ACT inhaler Inhale 1-2 puffs into the lungs every 4 (four) hours as needed for wheezing. 01/13/17   Lysbeth Penner, FNP  levothyroxine (SYNTHROID, LEVOTHROID) 88 MCG tablet Take 88 mcg by mouth daily before breakfast.    Historical Provider, MD  omeprazole (PRILOSEC) 40 MG capsule Take 40 mg by mouth daily. 10/06/16   Historical Provider, MD  prednisoLONE (PRELONE) 15 MG/5ML syrup Take 18ml po bid x 2 days then 7.5 ml po qd x 2 days then 20ml po qd x 4 days 01/13/17   Lysbeth Penner, FNP  ranitidine (ZANTAC) 300 MG tablet Take 1 tablet (300 mg total) by mouth at bedtime. Patient not taking: Reported on 11/02/2016 08/25/16   Lysbeth Penner, FNP  Spacer/Aero Chamber Mouthpiece MISC 1 Device by Does not apply route as directed. 01/13/17   Lysbeth Penner, FNP    Family History Family History  Problem Relation Age of Onset  . Heart disease Mother   . Diabetes Mother   . Kidney disease Father   . Breast cancer Sister   . Breast cancer Maternal Aunt   . Liver cancer Maternal Aunt   . Diabetes Maternal Aunt   . Liver cancer Paternal Uncle   . Breast cancer Maternal Grandmother   . Diabetes Maternal Grandmother   . Colon cancer  Neg Hx   . Stomach cancer Neg Hx   . Esophageal cancer Neg Hx   . Pancreatic cancer Neg Hx   . Rectal cancer Neg Hx     Social History Social History  Substance Use Topics  . Smoking status: Former Smoker    Packs/day: 1.00    Years: 15.00    Types: Cigarettes    Quit date: 10/21/1975  . Smokeless tobacco: Never Used  . Alcohol use 1.2 oz/week    2 Glasses of wine per week     Comment: 1-2 glasses of wine a week     Allergies   Azithromycin; Codeine; Minocin [minocycline]; Peanut-containing drug products; Penicillins; Sulfa antibiotics; Sulfacetamide sodium; Iohexol; Latex; and Pseudoephedrine-naproxen na er   Review of Systems Review of Systems  Constitutional: Negative for fever.  HENT: Positive for trouble  swallowing. Negative for sore throat.   Eyes: Negative for visual disturbance.  Respiratory: Positive for cough, shortness of breath and wheezing.   Cardiovascular: Negative for chest pain.  Gastrointestinal: Negative for abdominal pain.  Genitourinary: Negative for difficulty urinating.  Musculoskeletal: Negative for back pain and neck pain.  Skin: Negative for rash.  Neurological: Negative for syncope and headaches.     Physical Exam Updated Vital Signs BP (!) 102/54   Pulse 79   Temp 98.1 F (36.7 C) (Oral)   Resp 18   Ht 5\' 2"  (1.575 m)   Wt 110 lb (49.9 kg)   SpO2 93%   BMI 20.12 kg/m   Physical Exam  Constitutional: She is oriented to person, place, and time. She appears well-developed and well-nourished. No distress.  HENT:  Head: Normocephalic and atraumatic.  Eyes: Conjunctivae and EOM are normal.  Neck: Normal range of motion.  Cardiovascular: Normal rate, regular rhythm, normal heart sounds and intact distal pulses.  Exam reveals no gallop and no friction rub.   No murmur heard. Pulmonary/Chest: Effort normal. No respiratory distress. She has wheezes (occasional). She has no rales.  Abdominal: Soft. She exhibits no distension. There is no tenderness. There is no guarding.  Musculoskeletal: She exhibits no edema or tenderness.  Neurological: She is alert and oriented to person, place, and time.  Skin: Skin is warm and dry. No rash noted. She is not diaphoretic. No erythema.  Nursing note and vitals reviewed.    ED Treatments / Results  Labs (all labs ordered are listed, but only abnormal results are displayed) Labs Reviewed - No data to display  EKG  EKG Interpretation  Date/Time:  Friday January 30 2017 11:26:28 EDT Ventricular Rate:  85 PR Interval:  130 QRS Duration: 74 QT Interval:  378 QTC Calculation: 449 R Axis:   44 Text Interpretation:  Normal sinus rhythm Right atrial enlargement Borderline ECG No significant change since last tracing  Confirmed by [facility] MD, Nahiara Kretzschmar ([id number]) on 01/30/2017 5:31:03 PM       Radiology Dg Chest 2 View  Result Date: 01/30/2017 CLINICAL DATA:  Shortness of breath and cough for 2 days. EXAM: CHEST  2 VIEW COMPARISON:  PA and lateral chest 12/27/2016 and 08/05/2015. FINDINGS: The lungs are clear. Heart size is normal. No pneumothorax or pleural effusion. Aortic atherosclerosis is noted. No bony abnormality. IMPRESSION: No acute disease. Atherosclerosis. Electronically Signed   By: Inge Rise M.D.   On: 01/30/2017 12:19    Procedures Procedures (including critical care time)  Medications Ordered in ED Medications  albuterol (PROVENTIL) (2.5 MG/3ML) 0.083% nebulizer solution 2.5 mg (2.5 mg Nebulization Not Given 01/30/17  1314)  methylPREDNISolone sodium succinate (SOLU-MEDROL) 125 mg/2 mL injection 125 mg (125 mg Intramuscular Given 01/30/17 1319)  levalbuterol (XOPENEX) 0.63 MG/3ML nebulizer solution (0.63 mg  Given 01/30/17 1314)     Initial Impression / Assessment and Plan / ED Course  I have reviewed the triage vital signs and the nursing notes.  Pertinent labs & imaging results that were available during my care of the patient were reviewed by me and considered in my medical decision making (see chart for details).     76 year old female with history of dysphagia, esophageal stricture, intermittent episodes of cough and wheezing which she attributes to dysphagia for which she has seen pulmonology, ear nose and throat, and gastroenterology, and for which she's been seen in the emergency department several times. Chest x-ray shows no evidence of pneumonia or congestive heart failure. EKG shows no acute changes. Patient has history of similar episodes, she reports it has responded best to Xopenex and solumedrol.  These were provided and she reports significant improvement, feels back to baseline, ready for discharge.  Symptoms of dysphagia are more chronic, no drooling or sign of acute food  impaction. Patient discharged in stable condition with understanding of reasons to return.   Final Clinical Impressions(s) / ED Diagnoses   Final diagnoses:  Bronchitis  Dysphagia, unspecified type    New Prescriptions Discharge Medication List as of 01/30/2017  2:38 PM       Gareth Morgan, MD 01/30/17 1736

## 2017-02-02 DIAGNOSIS — M545 Low back pain: Secondary | ICD-10-CM | POA: Diagnosis not present

## 2017-02-02 DIAGNOSIS — M25552 Pain in left hip: Secondary | ICD-10-CM | POA: Diagnosis not present

## 2017-02-02 DIAGNOSIS — N3090 Cystitis, unspecified without hematuria: Secondary | ICD-10-CM | POA: Diagnosis not present

## 2017-02-23 DIAGNOSIS — F458 Other somatoform disorders: Secondary | ICD-10-CM | POA: Diagnosis not present

## 2017-02-23 DIAGNOSIS — R1314 Dysphagia, pharyngoesophageal phase: Secondary | ICD-10-CM | POA: Diagnosis not present

## 2017-02-23 DIAGNOSIS — K228 Other specified diseases of esophagus: Secondary | ICD-10-CM | POA: Diagnosis not present

## 2017-02-23 DIAGNOSIS — J385 Laryngeal spasm: Secondary | ICD-10-CM | POA: Diagnosis not present

## 2017-02-23 DIAGNOSIS — Z87891 Personal history of nicotine dependence: Secondary | ICD-10-CM | POA: Diagnosis not present

## 2017-02-23 DIAGNOSIS — R1310 Dysphagia, unspecified: Secondary | ICD-10-CM | POA: Diagnosis not present

## 2017-02-23 DIAGNOSIS — Z9889 Other specified postprocedural states: Secondary | ICD-10-CM | POA: Diagnosis not present

## 2017-03-08 ENCOUNTER — Ambulatory Visit (HOSPITAL_COMMUNITY)
Admission: EM | Admit: 2017-03-08 | Discharge: 2017-03-08 | Disposition: A | Discharge status: Home / Self Care | Payer: PPO | Attending: Internal Medicine | Admitting: Internal Medicine

## 2017-03-08 ENCOUNTER — Encounter (HOSPITAL_COMMUNITY): Payer: Self-pay | Admitting: Emergency Medicine

## 2017-03-08 DIAGNOSIS — R1310 Dysphagia, unspecified: Secondary | ICD-10-CM | POA: Diagnosis not present

## 2017-03-08 DIAGNOSIS — R05 Cough: Secondary | ICD-10-CM | POA: Diagnosis not present

## 2017-03-08 DIAGNOSIS — R062 Wheezing: Secondary | ICD-10-CM

## 2017-03-08 DIAGNOSIS — R059 Cough, unspecified: Secondary | ICD-10-CM

## 2017-03-08 MED ORDER — DEXAMETHASONE SODIUM PHOSPHATE 10 MG/ML IJ SOLN
INTRAMUSCULAR | Status: AC
  Filled 2017-03-08: qty 1

## 2017-03-08 MED ORDER — DEXAMETHASONE SODIUM PHOSPHATE 10 MG/ML IJ SOLN
10.0000 mg | Freq: Once | INTRAMUSCULAR | Status: AC
  Administered 2017-03-08: 10 mg via INTRAMUSCULAR

## 2017-03-08 NOTE — ED Triage Notes (Signed)
PT reports history of dysphagia, and cough related to esophageal problems. PT has been seen several times for the same complaint.

## 2017-03-08 NOTE — Discharge Instructions (Signed)
Follow up with your GI provider as needed.

## 2017-03-08 NOTE — ED Provider Notes (Signed)
CSN: [id number]     Arrival date & time 03/08/17  1353 History   First MD Initiated Contact with Patient 03/08/17 1513     Chief Complaint  Patient presents with  . Wheezing   (Consider location/radiation/quality/duration/timing/severity/associated sxs/prior Treatment) 76 year old female presents to clinic with a chief complaint of wheezing, and dysphagia. She states she has a significant history of this condition, and produces a folder containing discharge paperwork from last several visits. States she has an appointment with her GI doctor this coming week for a "scan" of her esophagus, try to find out the underlying cause of this condition. States is been ongoing since January 11. She further states the only drug that helps her is dexamethasone, and she is insistent on an injection today, her last injection of this drug appears to be in early April based on her medical records.   The history is provided by the patient.  Wheezing  Associated symptoms: cough     Past Medical History:  Diagnosis Date  . Allergy    seasonal  . Cough   . Diverticulitis   . Esophageal candidiasis ([place]) - treated 03/13/2016  . Esophageal stricture   . GERD (gastroesophageal reflux disease)   . IBS (irritable bowel syndrome)   . Kidney stones    right kidney  . Thyroid disease    Past Surgical History:  Procedure Laterality Date  . COLONOSCOPY    . ESOPHAGEAL DILATION    . FACIAL RECONSTRUCTION SURGERY    . HYSTEROTOMY    . lipoma right shoulder removal    . THYROIDECTOMY, PARTIAL    . TUBAL LIGATION    . UPPER GASTROINTESTINAL ENDOSCOPY     Family History  Problem Relation Age of Onset  . Heart disease Mother   . Diabetes Mother   . Kidney disease Father   . Breast cancer Sister   . Breast cancer Maternal Aunt   . Liver cancer Maternal Aunt   . Diabetes Maternal Aunt   . Liver cancer Paternal Uncle   . Breast cancer Maternal Grandmother   . Diabetes Maternal Grandmother   . Colon cancer  Neg Hx   . Stomach cancer Neg Hx   . Esophageal cancer Neg Hx   . Pancreatic cancer Neg Hx   . Rectal cancer Neg Hx    Social History  Substance Use Topics  . Smoking status: Former Smoker    Packs/day: 1.00    Years: 15.00    Types: Cigarettes    Quit date: 10/21/1975  . Smokeless tobacco: Never Used  . Alcohol use 1.2 oz/week    2 Glasses of wine per week     Comment: 1-2 glasses of wine a week   OB History    No data available     Review of Systems  Constitutional: Negative.   HENT: Positive for trouble swallowing.   Respiratory: Positive for cough and wheezing.   Cardiovascular: Negative.   Gastrointestinal: Negative.   Genitourinary: Negative.   Musculoskeletal: Negative.   Skin: Negative.   Neurological: Negative.     Allergies  Azithromycin; Codeine; Minocin [minocycline]; Peanut-containing drug products; Penicillins; Sulfa antibiotics; Sulfacetamide sodium; Iohexol; Latex; and Pseudoephedrine-naproxen na er  Home Medications   Prior to Admission medications   Medication Sig Start Date End Date Taking? Authorizing Provider  levalbuterol [facility] HFA) 45 MCG/ACT inhaler Inhale 1-2 puffs into the lungs every 4 (four) hours as needed for wheezing. 01/13/17   Lysbeth Penner, FNP  levothyroxine (SYNTHROID, LEVOTHROID) [phone]  MCG tablet Take 88 mcg by mouth daily before breakfast.    [provider]  omeprazole (PRILOSEC) 40 MG capsule Take 40 mg by mouth daily. 10/06/16   [provider]  prednisoLONE (PRELONE) 15 MG/5ML syrup Take 67ml po bid x 2 days then 7.5 ml po qd x 2 days then 57ml po qd x 4 days 01/13/17   Lysbeth Penner, FNP  Spacer/Aero Chamber Mouthpiece MISC 1 Device by Does not apply route as directed. 01/13/17   Lysbeth Penner, FNP   Meds Ordered and Administered this Visit   Medications  dexamethasone (DECADRON) injection 10 mg (10 mg Intramuscular Given 03/08/17 1527)    BP (!) 108/59 (BP Location: Right Arm)   Pulse 77   Temp  98.1 F (36.7 C) (Oral)   Resp 18   SpO2 97%  No data found.   Physical Exam  Constitutional: She is oriented to person, place, and time. She appears well-developed and well-nourished. No distress.  HENT:  Head: Normocephalic.  Right Ear: External ear normal.  Left Ear: External ear normal.  Nose: Nose normal.  Eyes: Conjunctivae are normal.  Neck: Normal range of motion.  Cardiovascular: Normal rate and regular rhythm.   Pulmonary/Chest: Effort normal and breath sounds normal.  Abdominal: Soft. Bowel sounds are normal.  Neurological: She is alert and oriented to person, place, and time.  Skin: Skin is warm and dry. Capillary refill takes less than 2 seconds. She is not diaphoretic.  Psychiatric: She has a normal mood and affect. Her behavior is normal.  Nursing note and vitals reviewed.   Urgent Care Course     Procedures (including critical care time)  Labs Review Labs Reviewed - No data to display  Imaging Review No results found.     MDM   1. Dysphagia, unspecified type   2. Cough    Pt give injection of dexamethasone, encouraged to keep appointment with her GI specialist.     Barnet Glasgow, NP 03/08/17 [phone]

## 2017-03-12 DIAGNOSIS — R319 Hematuria, unspecified: Secondary | ICD-10-CM | POA: Diagnosis not present

## 2017-03-12 DIAGNOSIS — C679 Malignant neoplasm of bladder, unspecified: Secondary | ICD-10-CM | POA: Diagnosis not present

## 2017-03-12 DIAGNOSIS — N390 Urinary tract infection, site not specified: Secondary | ICD-10-CM | POA: Diagnosis not present

## 2017-03-25 DIAGNOSIS — C679 Malignant neoplasm of bladder, unspecified: Secondary | ICD-10-CM | POA: Diagnosis not present

## 2017-03-25 DIAGNOSIS — R319 Hematuria, unspecified: Secondary | ICD-10-CM | POA: Diagnosis not present

## 2017-03-25 DIAGNOSIS — N390 Urinary tract infection, site not specified: Secondary | ICD-10-CM | POA: Diagnosis not present

## 2017-03-29 ENCOUNTER — Ambulatory Visit (HOSPITAL_COMMUNITY)
Admission: EM | Admit: 2017-03-29 | Discharge: 2017-03-29 | Disposition: A | Discharge status: Home / Self Care | Payer: PPO | Attending: Family Medicine | Admitting: Family Medicine

## 2017-03-29 ENCOUNTER — Encounter (HOSPITAL_COMMUNITY): Payer: Self-pay | Admitting: *Deleted

## 2017-03-29 DIAGNOSIS — K219 Gastro-esophageal reflux disease without esophagitis: Secondary | ICD-10-CM | POA: Diagnosis not present

## 2017-03-29 DIAGNOSIS — R053 Chronic cough: Secondary | ICD-10-CM

## 2017-03-29 DIAGNOSIS — R062 Wheezing: Secondary | ICD-10-CM | POA: Diagnosis not present

## 2017-03-29 DIAGNOSIS — R05 Cough: Secondary | ICD-10-CM

## 2017-03-29 DIAGNOSIS — R0602 Shortness of breath: Secondary | ICD-10-CM | POA: Diagnosis not present

## 2017-03-29 MED ORDER — PREDNISOLONE 15 MG/5ML PO SYRP: ORAL_SOLUTION | ORAL | 0 refills | Status: DC

## 2017-03-29 MED ORDER — DEXAMETHASONE SODIUM PHOSPHATE 10 MG/ML IJ SOLN
INTRAMUSCULAR | Status: AC
  Filled 2017-03-29: qty 1

## 2017-03-29 MED ORDER — DEXAMETHASONE SODIUM PHOSPHATE 10 MG/ML IJ SOLN
10.0000 mg | Freq: Once | INTRAMUSCULAR | Status: AC
  Administered 2017-03-29: 10 mg via INTRAMUSCULAR

## 2017-03-29 MED ORDER — IPRATROPIUM-ALBUTEROL 0.5-2.5 (3) MG/3ML IN SOLN: 3.0000 mL | Freq: Once | RESPIRATORY_TRACT | Status: DC

## 2017-03-29 MED ORDER — ALBUTEROL SULFATE (2.5 MG/3ML) 0.083% IN NEBU
2.5000 mg | INHALATION_SOLUTION | Freq: Once | RESPIRATORY_TRACT | Status: AC
  Administered 2017-03-29: 2.5 mg via RESPIRATORY_TRACT

## 2017-03-29 MED ORDER — ALBUTEROL SULFATE (2.5 MG/3ML) 0.083% IN NEBU
INHALATION_SOLUTION | RESPIRATORY_TRACT | Status: AC
  Filled 2017-03-29: qty 3

## 2017-03-29 NOTE — ED Triage Notes (Signed)
Pt   Was   Seen   approx   3   Weeks  Ago     For   Dysphagia     And     Cough      She  States   She  Got a  steriod    Shot   She  States    She  Uses  [employer]     And  Reports     Some  Shortness  Of  Breath

## 2017-03-29 NOTE — Discharge Instructions (Signed)
It was nice seeing you today. You cough symptoms might be due to COPD given hx of sputum production, hx of smoking and previous response to albuterol and steroid injection. Please see your PCP soon for PFT referral. Use Prelone as instructed. F/U as needed.

## 2017-03-29 NOTE — ED Provider Notes (Signed)
Anita [name]    CSN: [id number] Arrival date & time: 03/29/17  1507     History   Chief Complaint Chief Complaint  Patient presents with  . Cough    HPI Anita [name] Cilia is a 76 y.o. female.   The history is provided by the patient. No language interpreter was used.  Cough  Cough characteristics:  Productive (Cough for 2 yrs related to dysphagia) Sputum characteristics:  [place] and yellow Severity:  Moderate Onset quality:  Gradual Duration: 2 yrs associated with her Dysphagia. Timing:  Intermittent Progression:  Waxing and waning Chronicity:  Chronic Smoker: no (She smoked many years ago)   Context: not occupational exposure, not sick contacts and not upper respiratory infection   Context comment:  Chronic dysphagia due to esophageal stricture Relieved by:  Beta-agonist inhaler (40 mg Omeprazole daily. She had tried albuterol and dexamethasone shot in the past which helped. She uses her albuterol more often) Worsened by:  Activity Associated symptoms: shortness of breath   Associated symptoms: no chest pain, no fever, no rash, no sinus congestion, no sore throat and no wheezing   Associated symptoms comment:  Epigastric pain from GERD and coughing   Past Medical History:  Diagnosis Date  . Allergy    seasonal  . Cough   . Diverticulitis   . Esophageal candidiasis ([place]) - treated 03/13/2016  . Esophageal stricture   . GERD (gastroesophageal reflux disease)   . IBS (irritable bowel syndrome)   . Kidney stones    right kidney  . Thyroid disease     Patient Active Problem List   Diagnosis Date Noted  . Esophageal candidiasis ([place]) - treated 03/13/2016  . Dysphagia, pharyngoesophageal phase 09/20/2015  . Upper airway cough syndrome 09/20/2015    Past Surgical History:  Procedure Laterality Date  . COLONOSCOPY    . ESOPHAGEAL DILATION    . FACIAL RECONSTRUCTION SURGERY    . HYSTEROTOMY    . lipoma right shoulder removal    . THYROIDECTOMY, PARTIAL     . TUBAL LIGATION    . UPPER GASTROINTESTINAL ENDOSCOPY      OB History    No data available       Home Medications    Prior to Admission medications   Medication Sig Start Date End Date Taking? Authorizing Provider  levalbuterol [facility] HFA) 45 MCG/ACT inhaler Inhale 1-2 puffs into the lungs every 4 (four) hours as needed for wheezing. 01/13/17   Lysbeth Penner, FNP  levothyroxine (SYNTHROID, LEVOTHROID) 88 MCG tablet Take 88 mcg by mouth daily before breakfast.    [provider]  omeprazole (PRILOSEC) 40 MG capsule Take 40 mg by mouth daily. 10/06/16   [provider]  prednisoLONE (PRELONE) 15 MG/5ML syrup Take 71ml po bid x 2 days then 7.5 ml po qd x 2 days then 2ml po qd x 4 days 01/13/17   Lysbeth Penner, FNP  Spacer/Aero Chamber Mouthpiece MISC 1 Device by Does not apply route as directed. 01/13/17   Lysbeth Penner, FNP    Family History Family History  Problem Relation Age of Onset  . Heart disease Mother   . Diabetes Mother   . Kidney disease Father   . Breast cancer Sister   . Breast cancer Maternal Aunt   . Liver cancer Maternal Aunt   . Diabetes Maternal Aunt   . Liver cancer Paternal Uncle   . Breast cancer Maternal Grandmother   . Diabetes Maternal Grandmother   .  Colon cancer Neg Hx   . Stomach cancer Neg Hx   . Esophageal cancer Neg Hx   . Pancreatic cancer Neg Hx   . Rectal cancer Neg Hx     Social History Social History  Substance Use Topics  . Smoking status: Former Smoker    Packs/day: 1.00    Years: 15.00    Types: Cigarettes    Quit date: 10/21/1975  . Smokeless tobacco: Never Used  . Alcohol use 1.2 oz/week    2 Glasses of wine per week     Comment: 1-2 glasses of wine a week     Allergies   Azithromycin; Codeine; Minocin [minocycline]; Peanut-containing drug products; Penicillins; Sulfa antibiotics; Sulfacetamide sodium; Iohexol; Latex; and Pseudoephedrine-naproxen na er   Review of Systems Review of  Systems  Constitutional: Negative for fever.  HENT: Negative for sore throat.   Respiratory: Positive for cough and shortness of breath. Negative for wheezing.   Cardiovascular: Negative.  Negative for chest pain.  Gastrointestinal: Negative.   Genitourinary: Negative.   Skin: Negative for rash.  All other systems reviewed and are negative.    Physical Exam Triage Vital Signs ED Triage Vitals [03/29/17 1621]  Enc Vitals Group     BP 114/66     Pulse Rate 82     Resp 18     Temp 98.6 F (37 C)     Temp Source Oral     SpO2 96 %     Weight      Height      Head Circumference      Peak Flow      Pain Score      Pain Loc      Pain Edu?      Excl. in [place]?    No data found.   Updated Vital Signs BP 114/66 (BP Location: Right Arm)   Pulse 82   Temp 98.6 F (37 C) (Oral)   Resp 18   SpO2 96%   Visual Acuity Right Eye Distance:   Left Eye Distance:   Bilateral Distance:    Right Eye Near:   Left Eye Near:    Bilateral Near:     Physical Exam  Constitutional: She appears well-developed. No distress.  Eyes: EOM are normal.  Cardiovascular: Normal rate.   No murmur heard. Pulmonary/Chest: Effort normal. She has wheezes in the right upper field, the right middle field, the right lower field, the left upper field, the left middle field and the left lower field. She has no rhonchi.  Abdominal: Soft. Bowel sounds are normal. There is no tenderness.  Nursing note and vitals reviewed.    UC Treatments / Results  Labs (all labs ordered are listed, but only abnormal results are displayed) Labs Reviewed - No data to display  EKG  EKG Interpretation None       Radiology No results found.  Procedures Procedures (including critical care time)  Medications Ordered in UC Medications - No data to display   Initial Impression / Assessment and Plan / UC Course  I have reviewed the triage vital signs and the nursing notes.  Pertinent labs & imaging results  that were available during my care of the patient were reviewed by me and considered in my medical decision making (see chart for details).  Chronic cough  Wheezing  Gastroesophageal reflux disease, esophagitis presence not specified    Clinical Course as of Mar 29 1656  Sun Mar 29, 2017  1652 As discussed  with patient she likely have undiagnosed COPD given global expiratory wheeze, hx of smoking and previous response to Albuterol and Steroid. She will benefit from PFT. She will discuss with her PCP. Albuterol neb treatment given today. Decadron shot given due to previous response to treatment. Continue Albuterol as needed. Prelone prescribed. A/B not given today given recent S/E to antibiotic.  See PCP tomorrow. Return precaution discussed.  [MB]  3112 For GERD and esophageal stricture continue PPI as instructed. F/U with GI for further management.  [KE]    Clinical Course User Index [KE] Kinnie Feil, MD      Final Clinical Impressions(s) / UC Diagnoses   Final diagnoses:  None    New Prescriptions New Prescriptions   No medications on file     Kinnie Feil, MD 03/29/17 1658

## 2017-04-03 DIAGNOSIS — R319 Hematuria, unspecified: Secondary | ICD-10-CM | POA: Diagnosis not present

## 2017-04-03 DIAGNOSIS — N200 Calculus of kidney: Secondary | ICD-10-CM | POA: Diagnosis not present

## 2017-04-03 DIAGNOSIS — R310 Gross hematuria: Secondary | ICD-10-CM | POA: Diagnosis not present

## 2017-04-10 ENCOUNTER — Other Ambulatory Visit: Payer: Self-pay | Admitting: Family Medicine

## 2017-04-10 DIAGNOSIS — R05 Cough: Secondary | ICD-10-CM | POA: Diagnosis not present

## 2017-04-23 ENCOUNTER — Ambulatory Visit (HOSPITAL_COMMUNITY)
Admission: EM | Admit: 2017-04-23 | Discharge: 2017-04-23 | Disposition: A | Discharge status: Home / Self Care | Payer: PPO | Attending: Family Medicine | Admitting: Family Medicine

## 2017-04-23 ENCOUNTER — Encounter (HOSPITAL_COMMUNITY): Payer: Self-pay | Admitting: Family Medicine

## 2017-04-23 DIAGNOSIS — K2100 Gastro-esophageal reflux disease with esophagitis, without bleeding: Secondary | ICD-10-CM

## 2017-04-23 DIAGNOSIS — J9801 Acute bronchospasm: Secondary | ICD-10-CM

## 2017-04-23 DIAGNOSIS — K222 Esophageal obstruction: Secondary | ICD-10-CM

## 2017-04-23 MED ORDER — ALBUTEROL SULFATE (2.5 MG/3ML) 0.083% IN NEBU
INHALATION_SOLUTION | RESPIRATORY_TRACT | Status: AC
  Filled 2017-04-23: qty 3

## 2017-04-23 MED ORDER — DEXAMETHASONE SODIUM PHOSPHATE 10 MG/ML IJ SOLN
INTRAMUSCULAR | Status: AC
  Filled 2017-04-23: qty 1

## 2017-04-23 MED ORDER — DEXAMETHASONE SODIUM PHOSPHATE 10 MG/ML IJ SOLN
10.0000 mg | Freq: Once | INTRAMUSCULAR | Status: AC
  Administered 2017-04-23: 10 mg via INTRAMUSCULAR

## 2017-04-23 MED ORDER — ALBUTEROL SULFATE (2.5 MG/3ML) 0.083% IN NEBU
2.5000 mg | INHALATION_SOLUTION | Freq: Once | RESPIRATORY_TRACT | Status: AC
  Administered 2017-04-23: 2.5 mg via RESPIRATORY_TRACT

## 2017-04-23 NOTE — ED Triage Notes (Signed)
Pt here for chronic dysphagia. sts x 3 days.

## 2017-04-23 NOTE — Discharge Instructions (Signed)
Continue your medications as directed. Also he may try the tablets that he talked about the contains Pepcid and calcium carbonate which is like Tums. Ask the pharmacist about this combination of medicine. He can chew it with some water in your mouth and will go down like powder. Follow-up with her gastroenterologist. Use your inhaler as needed for wheezing.

## 2017-04-23 NOTE — ED Provider Notes (Signed)
CSN: [id number]     Arrival date & time 04/23/17  1706 History   First MD Initiated Contact with Patient 04/23/17 1844     Chief Complaint  Patient presents with  . Dysphagia   (Consider location/radiation/quality/duration/timing/severity/associated sxs/prior Treatment) 76 year old female with history of esophageal stricture, possible ileus syndrome, severe  GERD and bronchospasm. She smoked several years ago and likely has some level of COPD. She presents today for similar symptoms that she has been to her physician, urgent care and ED for. She is complaining of persistent food getting stuck in her esophagus, reflux and bronchospasm. She is requesting another shot of Decadron and liquid prednisone along with a breathing treatment. She has used 2 of her Xopenex doses today with modest improvement.       Past Medical History:  Diagnosis Date  . Allergy    seasonal  . Cough   . Diverticulitis   . Esophageal candidiasis ([place]) - treated 03/13/2016  . Esophageal stricture   . GERD (gastroesophageal reflux disease)   . IBS (irritable bowel syndrome)   . Kidney stones    right kidney  . Thyroid disease    Past Surgical History:  Procedure Laterality Date  . COLONOSCOPY    . ESOPHAGEAL DILATION    . FACIAL RECONSTRUCTION SURGERY    . HYSTEROTOMY    . lipoma right shoulder removal    . THYROIDECTOMY, PARTIAL    . TUBAL LIGATION    . UPPER GASTROINTESTINAL ENDOSCOPY     Family History  Problem Relation Age of Onset  . Heart disease Mother   . Diabetes Mother   . Kidney disease Father   . Breast cancer Sister   . Breast cancer Maternal Aunt   . Liver cancer Maternal Aunt   . Diabetes Maternal Aunt   . Liver cancer Paternal Uncle   . Breast cancer Maternal Grandmother   . Diabetes Maternal Grandmother   . Colon cancer Neg Hx   . Stomach cancer Neg Hx   . Esophageal cancer Neg Hx   . Pancreatic cancer Neg Hx   . Rectal cancer Neg Hx    Social History  Substance Use Topics   . Smoking status: Former Smoker    Packs/day: 1.00    Years: 15.00    Types: Cigarettes    Quit date: 10/21/1975  . Smokeless tobacco: Never Used  . Alcohol use 1.2 oz/week    2 Glasses of wine per week     Comment: 1-2 glasses of wine a week   OB History    No data available     Review of Systems  Constitutional: Positive for activity change. Negative for fever.  HENT: Positive for trouble swallowing.        The sulfa Jill discomfort per patient and reflux.  Respiratory: Positive for cough, shortness of breath and wheezing.   Cardiovascular: Negative.   Gastrointestinal: Negative.   Genitourinary: Negative.   Neurological: Negative.   All other systems reviewed and are negative.   Allergies  Azithromycin; Codeine; Minocin [minocycline]; Peanut-containing drug products; Penicillins; Sulfa antibiotics; Sulfacetamide sodium; Iohexol; Latex; and Pseudoephedrine-naproxen na er  Home Medications   Prior to Admission medications   Medication Sig Start Date End Date Taking? Authorizing Provider  levalbuterol [facility] HFA) 45 MCG/ACT inhaler Inhale 1-2 puffs into the lungs every 4 (four) hours as needed for wheezing. 01/13/17   Lysbeth Penner, FNP  levothyroxine (SYNTHROID, LEVOTHROID) 88 MCG tablet Take 88 mcg by mouth daily before breakfast.  [provider]  omeprazole (PRILOSEC) 40 MG capsule Take 40 mg by mouth daily. 10/06/16   [provider]  prednisoLONE (PRELONE) 15 MG/5ML syrup Take 63ml po bid x 2 days then 7.5 ml po qd x 2 days then 68ml po qd x 4 days 03/29/17   Kinnie Feil, MD  Spacer/Aero Chamber Mouthpiece MISC 1 Device by Does not apply route as directed. 01/13/17   Lysbeth Penner, FNP   Meds Ordered and Administered this Visit   Medications  albuterol (PROVENTIL) (2.5 MG/3ML) 0.083% nebulizer solution 2.5 mg (2.5 mg Nebulization Given 04/23/17 1911)  dexamethasone (DECADRON) injection 10 mg (10 mg Intramuscular Given 04/23/17 1910)    BP  (!) 117/53   Pulse 71   Temp 98.2 F (36.8 C)   Resp 18   SpO2 98%  No data found.   Physical Exam  Constitutional: She is oriented to person, place, and time. She appears well-developed and well-nourished. No distress.  HENT:  Head: Normocephalic and atraumatic.  Eyes: EOM are normal.  Neck: Normal range of motion. Neck supple.  Cardiovascular: Normal rate, regular rhythm and normal heart sounds.   Pulmonary/Chest: Effort normal. No respiratory distress. She has wheezes.  Bilateral diffuse coarseness and wheezing with prolonged expiratory phase. Frequent inspiratory wheezes as well. Fair air movement. Able to speak in complete paragraphs and is actually quite loquacious.  Musculoskeletal: Normal range of motion.  Neurological: She is alert and oriented to person, place, and time.  Nursing note and vitals reviewed.   Urgent Care Course     Procedures (including critical care time)  Labs Review Labs Reviewed - No data to display  Imaging Review No results found.   Visual Acuity Review  Right Eye Distance:   Left Eye Distance:   Bilateral Distance:    Right Eye Near:   Left Eye Near:    Bilateral Near:         MDM   1. Bronchospasm   2. Gastroesophageal reflux disease with esophagitis   3. Esophageal stricture    Post albuterol neb the patient feels much better, is breathing better, improved air movement and no more wheezing or coarseness. Continue your medications as directed. Also he may try the tablets that he talked about the contains Pepcid and calcium carbonate which is like Tums. Ask the pharmacist about this combination of medicine. He can chew it with some water in your mouth and will go down like powder. Follow-up with her gastroenterologist. Use your inhaler as needed for wheezing.     Janne Napoleon, NP 04/23/17 1946

## 2017-04-23 NOTE — ED Notes (Signed)
Notified david mabe, np that breathing treatment is completed.

## 2017-05-02 ENCOUNTER — Observation Stay (HOSPITAL_BASED_OUTPATIENT_CLINIC_OR_DEPARTMENT_OTHER)
Admission: EM | Admit: 2017-05-02 | Discharge: 2017-05-03 | Disposition: A | Discharge status: Home / Self Care | Payer: PPO | Attending: Family Medicine | Admitting: Family Medicine

## 2017-05-02 ENCOUNTER — Observation Stay (HOSPITAL_COMMUNITY): Payer: PPO

## 2017-05-02 ENCOUNTER — Encounter (HOSPITAL_BASED_OUTPATIENT_CLINIC_OR_DEPARTMENT_OTHER): Payer: Self-pay | Admitting: Emergency Medicine

## 2017-05-02 ENCOUNTER — Emergency Department (HOSPITAL_BASED_OUTPATIENT_CLINIC_OR_DEPARTMENT_OTHER): Payer: PPO

## 2017-05-02 DIAGNOSIS — K219 Gastro-esophageal reflux disease without esophagitis: Secondary | ICD-10-CM | POA: Insufficient documentation

## 2017-05-02 DIAGNOSIS — F05 Delirium due to known physiological condition: Secondary | ICD-10-CM | POA: Diagnosis not present

## 2017-05-02 DIAGNOSIS — R0602 Shortness of breath: Secondary | ICD-10-CM | POA: Diagnosis not present

## 2017-05-02 DIAGNOSIS — E079 Disorder of thyroid, unspecified: Secondary | ICD-10-CM | POA: Diagnosis not present

## 2017-05-02 DIAGNOSIS — R072 Precordial pain: Principal | ICD-10-CM

## 2017-05-02 DIAGNOSIS — R05 Cough: Secondary | ICD-10-CM | POA: Insufficient documentation

## 2017-05-02 DIAGNOSIS — R059 Cough, unspecified: Secondary | ICD-10-CM

## 2017-05-02 DIAGNOSIS — R079 Chest pain, unspecified: Secondary | ICD-10-CM | POA: Diagnosis present

## 2017-05-02 DIAGNOSIS — R1310 Dysphagia, unspecified: Secondary | ICD-10-CM | POA: Insufficient documentation

## 2017-05-02 DIAGNOSIS — Z79899 Other long term (current) drug therapy: Secondary | ICD-10-CM | POA: Diagnosis not present

## 2017-05-02 DIAGNOSIS — Z87891 Personal history of nicotine dependence: Secondary | ICD-10-CM | POA: Diagnosis not present

## 2017-05-02 DIAGNOSIS — R911 Solitary pulmonary nodule: Secondary | ICD-10-CM | POA: Diagnosis not present

## 2017-05-02 DIAGNOSIS — J439 Emphysema, unspecified: Secondary | ICD-10-CM | POA: Diagnosis present

## 2017-05-02 DIAGNOSIS — R7989 Other specified abnormal findings of blood chemistry: Secondary | ICD-10-CM

## 2017-05-02 DIAGNOSIS — R1314 Dysphagia, pharyngoesophageal phase: Secondary | ICD-10-CM | POA: Diagnosis present

## 2017-05-02 LAB — CBC WITH DIFFERENTIAL/PLATELET
BASOS ABS: 0.1 10*3/uL (ref 0.0–0.1)
Basophils Relative: 1 %
EOS ABS: 1.4 10*3/uL — AB (ref 0.0–0.7)
EOS PCT: 14 %
HCT: 45 % (ref 36.0–46.0)
Hemoglobin: 15.3 g/dL — ABNORMAL HIGH (ref 12.0–15.0)
LYMPHS PCT: 30 %
Lymphs Abs: 3 10*3/uL (ref 0.7–4.0)
MCH: 30.3 pg (ref 26.0–34.0)
MCHC: 34 g/dL (ref 30.0–36.0)
MCV: 89.1 fL (ref 78.0–100.0)
MONO ABS: 0.9 10*3/uL (ref 0.1–1.0)
Monocytes Relative: 10 %
Neutro Abs: 4.5 10*3/uL (ref 1.7–7.7)
Neutrophils Relative %: 45 %
PLATELETS: 346 10*3/uL (ref 150–400)
RBC: 5.05 MIL/uL (ref 3.87–5.11)
RDW: 12.9 % (ref 11.5–15.5)
WBC: 9.8 10*3/uL (ref 4.0–10.5)

## 2017-05-02 LAB — COMPREHENSIVE METABOLIC PANEL
ALT: 15 U/L (ref 14–54)
AST: 21 U/L (ref 15–41)
Albumin: 4.2 g/dL (ref 3.5–5.0)
Alkaline Phosphatase: 96 U/L (ref 38–126)
Anion gap: 9 (ref 5–15)
BUN: 9 mg/dL (ref 6–20)
CO2: 27 mmol/L (ref 22–32)
Calcium: 9.4 mg/dL (ref 8.9–10.3)
Chloride: 102 mmol/L (ref 101–111)
Creatinine, Ser: 0.77 mg/dL (ref 0.44–1.00)
Glucose, Bld: 105 mg/dL — ABNORMAL HIGH (ref 65–99)
POTASSIUM: 4.3 mmol/L (ref 3.5–5.1)
SODIUM: 138 mmol/L (ref 135–145)
Total Bilirubin: 0.4 mg/dL (ref 0.3–1.2)
Total Protein: 7.5 g/dL (ref 6.5–8.1)

## 2017-05-02 LAB — TROPONIN I: Troponin I: <0.03 ng/mL (ref <0.03)

## 2017-05-02 LAB — D-DIMER, QUANTITATIVE: D-Dimer, Quant: 2.12 ug/mL-FEU — ABNORMAL HIGH (ref 0.00–0.50)

## 2017-05-02 MED ORDER — ONDANSETRON HCL 4 MG/2ML IJ SOLN
4.0000 mg | Freq: Four times a day (QID) | INTRAMUSCULAR | Status: DC | PRN
Start: 2017-05-02 — End: 2017-05-03

## 2017-05-02 MED ORDER — SENNOSIDES-DOCUSATE SODIUM 8.6-50 MG PO TABS: 1.0000 | ORAL_TABLET | Freq: Every evening | ORAL | Status: DC | PRN

## 2017-05-02 MED ORDER — ENOXAPARIN SODIUM 60 MG/0.6ML ~~LOC~~ SOLN: 1.0000 mg/kg | Freq: Two times a day (BID) | SUBCUTANEOUS | Status: DC

## 2017-05-02 MED ORDER — GI COCKTAIL ~~LOC~~
30.0000 mL | Freq: Once | ORAL | Status: AC
  Administered 2017-05-02: 30 mL via ORAL
  Filled 2017-05-02: qty 30

## 2017-05-02 MED ORDER — ACETAMINOPHEN 650 MG RE SUPP: 650.0000 mg | Freq: Four times a day (QID) | RECTAL | Status: DC | PRN

## 2017-05-02 MED ORDER — ALBUTEROL SULFATE (2.5 MG/3ML) 0.083% IN NEBU
2.5000 mg | INHALATION_SOLUTION | Freq: Once | RESPIRATORY_TRACT | Status: AC
Start: 2017-05-02 — End: 2017-05-02
  Administered 2017-05-02: 2.5 mg via RESPIRATORY_TRACT
  Filled 2017-05-02: qty 3

## 2017-05-02 MED ORDER — IPRATROPIUM BROMIDE 0.02 % IN SOLN
0.5000 mg | Freq: Three times a day (TID) | RESPIRATORY_TRACT | Status: DC
  Filled 2017-05-02: qty 2.5

## 2017-05-02 MED ORDER — POTASSIUM CHLORIDE IN NACL 20-0.9 MEQ/L-% IV SOLN
INTRAVENOUS | Status: DC
  Administered 2017-05-02 – 2017-05-03 (×2): 100 mL/h via INTRAVENOUS
  Filled 2017-05-02 (×2): qty 1000

## 2017-05-02 MED ORDER — ONDANSETRON HCL 4 MG PO TABS: 4.0000 mg | ORAL_TABLET | Freq: Four times a day (QID) | ORAL | Status: DC | PRN

## 2017-05-02 MED ORDER — PANTOPRAZOLE SODIUM 40 MG PO TBEC
40.0000 mg | DELAYED_RELEASE_TABLET | Freq: Every day | ORAL | Status: DC
  Administered 2017-05-03: 40 mg via ORAL
  Filled 2017-05-02 (×2): qty 1

## 2017-05-02 MED ORDER — IPRATROPIUM BROMIDE 0.02 % IN SOLN
0.5000 mg | Freq: Four times a day (QID) | RESPIRATORY_TRACT | Status: DC
  Administered 2017-05-02: 0.5 mg via RESPIRATORY_TRACT
  Filled 2017-05-02: qty 2.5

## 2017-05-02 MED ORDER — ACETAMINOPHEN 325 MG PO TABS: 650.0000 mg | ORAL_TABLET | Freq: Four times a day (QID) | ORAL | Status: DC | PRN

## 2017-05-02 MED ORDER — LEVALBUTEROL HCL 1.25 MG/0.5ML IN NEBU: 1.2500 mg | INHALATION_SOLUTION | Freq: Three times a day (TID) | RESPIRATORY_TRACT | Status: DC

## 2017-05-02 MED ORDER — LEVALBUTEROL HCL 1.25 MG/0.5ML IN NEBU
1.2500 mg | INHALATION_SOLUTION | Freq: Four times a day (QID) | RESPIRATORY_TRACT | Status: DC
  Administered 2017-05-02: 1.25 mg via RESPIRATORY_TRACT
  Filled 2017-05-02: qty 0.5

## 2017-05-02 MED ORDER — LEVALBUTEROL HCL 0.63 MG/3ML IN NEBU
0.6300 mg | INHALATION_SOLUTION | Freq: Four times a day (QID) | RESPIRATORY_TRACT | Status: DC | PRN
Start: 2017-05-02 — End: 2017-05-03
  Administered 2017-05-03: 0.63 mg via RESPIRATORY_TRACT
  Filled 2017-05-02: qty 3

## 2017-05-02 MED ORDER — ENOXAPARIN SODIUM 60 MG/0.6ML ~~LOC~~ SOLN
50.0000 mg | Freq: Two times a day (BID) | SUBCUTANEOUS | Status: DC
  Administered 2017-05-02: 50 mg via SUBCUTANEOUS
  Filled 2017-05-02 (×2): qty 0.6

## 2017-05-02 MED ORDER — LEVALBUTEROL HCL 1.25 MG/0.5ML IN NEBU
1.2500 mg | INHALATION_SOLUTION | Freq: Three times a day (TID) | RESPIRATORY_TRACT | Status: DC
  Filled 2017-05-02: qty 0.5

## 2017-05-02 MED ORDER — SODIUM CHLORIDE 0.9% FLUSH
3.0000 mL | Freq: Two times a day (BID) | INTRAVENOUS | Status: DC
  Administered 2017-05-02: 3 mL via INTRAVENOUS

## 2017-05-02 MED ORDER — LEVOTHYROXINE SODIUM 88 MCG PO TABS
88.0000 ug | ORAL_TABLET | Freq: Every day | ORAL | Status: DC
  Administered 2017-05-03: 88 ug via ORAL
  Filled 2017-05-02: qty 1

## 2017-05-02 MED ORDER — ENOXAPARIN SODIUM 60 MG/0.6ML ~~LOC~~ SOLN: 50.0000 mg | Freq: Two times a day (BID) | SUBCUTANEOUS | Status: DC

## 2017-05-02 MED ORDER — ENOXAPARIN SODIUM 60 MG/0.6ML ~~LOC~~ SOLN
50.0000 mg | Freq: Once | SUBCUTANEOUS | Status: AC
  Administered 2017-05-02: 50 mg via SUBCUTANEOUS
  Filled 2017-05-02: qty 0.6

## 2017-05-02 NOTE — Progress Notes (Signed)
ANTICOAGULATION CONSULT NOTE - Initial Consult  Pharmacy Consult :  Lovenox Indication:  Rule out PE  Allergies  Allergen Reactions  . Azithromycin     Airway swelling, difficult breathing, stomach swelling  . Codeine Anaphylaxis  . Minocin [Minocycline] Anaphylaxis  . Peanut-Containing Drug Products Anaphylaxis  . Penicillins Anaphylaxis    Has patient had a PCN reaction causing immediate rash, facial/tongue/throat swelling, SOB or lightheadedness with hypotension: YES Has patient had a PCN reaction causing severe rash involving mucus membranes or skin nec  Has patient had a PCN reaction that required hospitalization NO Has patient had a PCN reaction occurring within the last 10 years: NO If all of the above answers are "NO", then may proceed with Cephalosporin use.  . Sulfa Antibiotics Anaphylaxis  . Sulfacetamide Sodium Anaphylaxis  . Iohexol      Desc: ALLERGIC TO IV CONTRAST-- RESPIRATORY ARREST   . Latex   . Pseudoephedrine-Naproxen Na Er     Quit breathing    Patient Measurements: Height: 5\' 1"  (154.9 cm) Weight: 115 lb (52.2 kg) IBW/kg (Calculated) : 47.8  Vital Signs: Temp: 98.2 F (36.8 C) (07/14 0945) Temp Source: Oral (07/14 0945) BP: 109/58 (07/14 1152) Pulse Rate: 81 (07/14 1200)  Labs:  Recent Labs  05/02/17 0942  HGB 15.3*  HCT 45.0  PLT 346  CREATININE 0.77  TROPONINI <0.03    Estimated Creatinine Clearance: 44.4 mL/min (by C-G formula based on SCr of 0.77 mg/dL).   Medical History: Past Medical History:  Diagnosis Date  . Allergy    seasonal  . Cough   . Diverticulitis   . Esophageal candidiasis ([place]) - treated 03/13/2016  . Esophageal stricture   . GERD (gastroesophageal reflux disease)   . IBS (irritable bowel syndrome)   . Kidney stones    right kidney  . Thyroid disease       Assessment: 19 YOF presented to [facility] with SOB and chest pain for one month.  Pharmacy consulted to initiate Lovenox for rule out PE.  CMP and CBC  stable.  D-dimer elevated at 2.12.   Goal of Therapy:  Anti-Xa level 0.6-1 units/ml 4hrs after LMWH dose given Monitor platelets by anticoagulation protocol: Yes    Plan:  - Lovenox 50mg  SQ Q12H - CBC Q72H while on Lovenox - F/U with confirmation of diagnosis   Ralonda Tartt D. Mina Marble, PharmD, BCPS Pager:  [phone] 05/02/2017, 12:51 PM

## 2017-05-02 NOTE — ED Triage Notes (Addendum)
SOB and chest pain x 1 month, with pain to back. Was eval on 04/23/17 for same at [facility]. Also c/o of cough and ongoing problems with dysphagia with stricture

## 2017-05-02 NOTE — H&P (Signed)
History and Physical    Anita [name] JYN:[id number] DOB: Jan 19, 1941 DOA: 05/02/2017  PCP: Delilah Shan, MD  Patient coming from: [facility]  I have personally briefly reviewed patient's old medical records in [place]  Chief Complaint: cough and shortness of breath times weeks to months  HPI: Anita [name] is a 76 y.o. female with medical history significant of emphysema (Noted on chest x-ray and 27 and CT scan in 2011), cough, diverticulitis, gastroesophageal reflux disease which is severe irritable bowel syndrome kidney stones and recurrent shortness of breath and chest pains. The patient presents with one month of chest pain shortness breath. Comes to the ED complaining of cough productive with brown sputum. Describes chest pain as a pressure. EDP gave patient a heart score 4 & EDP feels high suspicion of PE.  She has had at least one doctor visit on July 5 with steroids and nebulizers were prescribed for her and she did not have any results with them. She also received antibiotics at that time and has had continued problems. Patient had a reaction to contrast dye in the past and arrested. Will need to come to [facility] for VQ scan. Initial troponin negative. In the emergency department she was noted to have an elevated d-dimer her symptoms have been so long and her troponin was negative was felt that she is not having any cardiac issues rather that there was a pulmonary component to this. She'll be admitted for evaluation of chest pain possibly due to pulmonary embolism versus acute exacerbation of COPD. Patient does have a remote history of smoking and she has lived with smokers for CT scans in the past have shown emphysema in the upper lobes.   Review of Systems: As per HPI otherwise 10 point review of systems negative.  Review of Systems  Constitutional: Negative for chills and fever.  HENT: Negative for hearing loss and tinnitus.   Eyes: Negative for blurred vision and double  vision.  Respiratory: Positive for cough, sputum production, shortness of breath and wheezing.   Cardiovascular: Positive for chest pain. Negative for palpitations, orthopnea, claudication and leg swelling.  Gastrointestinal: Negative for heartburn, nausea and vomiting.  Genitourinary: Negative for dysuria and urgency.  Musculoskeletal: Negative for myalgias and neck pain.  Skin: Negative for itching and rash.  Neurological: Negative for dizziness, weakness and headaches.  Endo/Heme/Allergies: Negative for environmental allergies. Does not bruise/bleed easily.  Psychiatric/Behavioral: Negative for depression and suicidal ideas.    Past Medical History:  Diagnosis Date  . Allergy    seasonal  . Cough   . Diverticulitis   . Esophageal candidiasis ([place]) - treated 03/13/2016  . Esophageal stricture   . GERD (gastroesophageal reflux disease)   . IBS (irritable bowel syndrome)   . Kidney stones    right kidney  . Thyroid disease     Past Surgical History:  Procedure Laterality Date  . COLONOSCOPY    . ESOPHAGEAL DILATION    . FACIAL RECONSTRUCTION SURGERY    . HYSTEROTOMY    . lipoma right shoulder removal    . THYROIDECTOMY, PARTIAL    . TUBAL LIGATION    . UPPER GASTROINTESTINAL ENDOSCOPY       reports that she quit smoking about 41 years ago. Her smoking use included Cigarettes. She has a 15.00 pack-year smoking history. She has never used smokeless tobacco. She reports that she drinks about 1.2 oz of alcohol per week . She reports that she does not use drugs.  Allergies  Allergen Reactions  . Azithromycin     Airway swelling, difficult breathing, stomach swelling  . Codeine Anaphylaxis  . Minocin [Minocycline] Anaphylaxis  . Peanut-Containing Drug Products Anaphylaxis  . Penicillins Anaphylaxis    Has patient had a PCN reaction causing immediate rash, facial/tongue/throat swelling, SOB or lightheadedness with hypotension: YES Has patient had a PCN reaction causing  severe rash involving mucus membranes or skin nec  Has patient had a PCN reaction that required hospitalization NO Has patient had a PCN reaction occurring within the last 10 years: NO If all of the above answers are "NO", then may proceed with Cephalosporin use.  . Sulfa Antibiotics Anaphylaxis  . Sulfacetamide Sodium Anaphylaxis  . Iohexol Other (See Comments)     Desc: ALLERGIC TO IV CONTRAST-- RESPIRATORY ARREST   . Latex   . Pseudoephedrine-Naproxen Na Er Other (See Comments)    Quit breathing    Family History  Problem Relation Age of Onset  . Heart disease Mother   . Diabetes Mother   . Kidney disease Father   . Breast cancer Sister   . Breast cancer Maternal Aunt   . Liver cancer Maternal Aunt   . Diabetes Maternal Aunt   . Liver cancer Paternal Uncle   . Breast cancer Maternal Grandmother   . Diabetes Maternal Grandmother   . Colon cancer Neg Hx   . Stomach cancer Neg Hx   . Esophageal cancer Neg Hx   . Pancreatic cancer Neg Hx   . Rectal cancer Neg Hx      Prior to Admission medications   Medication Sig Start Date End Date Taking? Authorizing Provider  levalbuterol [facility] HFA) 45 MCG/ACT inhaler Inhale 1-2 puffs into the lungs every 4 (four) hours as needed for wheezing. 01/13/17  Yes Lysbeth Penner, FNP  levothyroxine (SYNTHROID, LEVOTHROID) 88 MCG tablet Take 88 mcg by mouth daily before breakfast.   Yes [provider]  omeprazole (PRILOSEC) 40 MG capsule Take 40 mg by mouth daily. 10/06/16  Yes [provider]  Spacer/Aero Chamber Mouthpiece MISC 1 Device by Does not apply route as directed. 01/13/17  Yes Lysbeth Penner, FNP  prednisoLONE (PRELONE) 15 MG/5ML syrup Take 26ml po bid x 2 days then 7.5 ml po qd x 2 days then 79ml po qd x 4 days 03/29/17   Kinnie Feil, MD    Physical Exam: Vitals:   05/02/17 1545 05/02/17 1600 05/02/17 1615 05/02/17 1745  BP:  (!) 106/59  118/65  Pulse: 70 70 71 71  Resp: 10 15 19    Temp:    98.1 F  (36.7 C)  TempSrc:    Oral  SpO2: 97% 96% 93% 97%  Weight:    50.3 kg (111 lb)  Height:    5' 1.5" (1.562 m)   .TCS Constitutional: NAD, calm, comfortable Vitals:   05/02/17 1545 05/02/17 1600 05/02/17 1615 05/02/17 1745  BP:  (!) 106/59  118/65  Pulse: 70 70 71 71  Resp: 10 15 19    Temp:    98.1 F (36.7 C)  TempSrc:    Oral  SpO2: 97% 96% 93% 97%  Weight:    50.3 kg (111 lb)  Height:    5' 1.5" (1.562 m)   Eyes: PERRL, lids and conjunctivae normal ENMT: Mucous membranes are moist. Posterior pharynx clear of any exudate or lesions.Normal dentition.  Neck: normal, supple, no masses, no thyromegaly Respiratory: clear to auscultation bilaterally, no wheezing, no crackles. Normal respiratory effort. No  accessory muscle use.  Cardiovascular: Regular rate and rhythm, no murmurs / rubs / gallops. No extremity edema. 2+ pedal pulses. No carotid bruits.  Abdomen: no tenderness, no masses palpated. No hepatosplenomegaly. Bowel sounds positive.  Musculoskeletal: no clubbing / cyanosis. No joint deformity upper and lower extremities. Good ROM, no contractures. Normal muscle tone.  Skin: no rashes, lesions, ulcers. No induration Neurologic: CN 2-12 grossly intact. Sensation intact, DTR normal. Strength 5/5 in all 4.  Psychiatric: Normal judgment and insight. Alert and oriented x 3. Normal mood.   Labs on Admission: I have personally reviewed following labs and imaging studies  CBC:  Recent Labs Lab 05/02/17 0942  WBC 9.8  NEUTROABS 4.5  HGB 15.3*  HCT 45.0  MCV 89.1  PLT 505   Basic Metabolic Panel:  Recent Labs Lab 05/02/17 0942  NA 138  K 4.3  CL 102  CO2 27  GLUCOSE 105*  BUN 9  CREATININE 0.77  CALCIUM 9.4   GFR: Estimated Creatinine Clearance: 45.6 mL/min (by C-G formula based on SCr of 0.77 mg/dL). Liver Function Tests:  Recent Labs Lab 05/02/17 0942  AST 21  ALT 15  ALKPHOS 96  BILITOT 0.4  PROT 7.5  ALBUMIN 4.2   No results for input(s): LIPASE,  AMYLASE in the last 168 hours. No results for input(s): AMMONIA in the last 168 hours. Coagulation Profile: No results for input(s): INR, PROTIME in the last 168 hours. Cardiac Enzymes:  Recent Labs Lab 05/02/17 0942 05/02/17 1228  TROPONINI <0.03 <0.03   BNP (last 3 results) No results for input(s): PROBNP in the last 8760 hours. HbA1C: No results for input(s): HGBA1C in the last 72 hours. CBG: No results for input(s): GLUCAP in the last 168 hours. Lipid Profile: No results for input(s): CHOL, HDL, LDLCALC, TRIG, CHOLHDL, LDLDIRECT in the last 72 hours. Thyroid Function Tests: No results for input(s): TSH, T4TOTAL, FREET4, T3FREE, THYROIDAB in the last 72 hours. Anemia Panel: No results for input(s): VITAMINB12, FOLATE, FERRITIN, TIBC, IRON, RETICCTPCT in the last 72 hours. Urine analysis: No results found for: COLORURINE, APPEARANCEUR, LABSPEC, PHURINE, GLUCOSEU, HGBUR, BILIRUBINUR, KETONESUR, PROTEINUR, UROBILINOGEN, NITRITE, LEUKOCYTESUR  Radiological Exams on Admission: Dg Chest 2 View  Result Date: 05/02/2017 CLINICAL DATA:  76 year-old female c/o SOB and chest pain x 1 month, with pain to back. Was eval on 04/23/17 for same at [facility]. Also c/o of cough and ongoing problems with dysphagia with strictureHx of GERD EXAM: CHEST  2 VIEW COMPARISON:  01/30/2017 FINDINGS: Cardiac silhouette is normal in size. No mediastinal or hilar masses. No evidence of adenopathy. Lungs are hyperexpanded but clear. No pleural effusion or pneumothorax. Skeletal structures are demineralized but intact. IMPRESSION: No acute cardiopulmonary disease. Electronically Signed   By: Lajean Manes M.D.   On: 05/02/2017 10:10    EKG: Independently reviewed. Shows a sinus rhythm with biatrial enlargement and nonspecific ST-T wave abnormalities when compared to previous it is unchanged.   Assessment/Plan Principal Problem:   Chest pain Active Problems:   Shortness of breath   Cough   Emphysema  of lung (HCC)   Dysphagia, pharyngoesophageal phase    1. Chest pain: Given the possibility of cardiac symptoms and suspect that this is more pulmonary in origin. Review of her old records shows that a chest x-ray and a CT scan in the past showed evidence of emphysema. Patient is currently not adequately treated for COPD however she does have a positive d-dimer and the symptoms are intermittent. Therefore  will start her on a pulmonary program to include nebulizers and oxygen treatment if necessary also will check a VQ scan as well as a CT scan of the chest. Due to her allergy to dye will do a noncontrast CT scan if it is very abnormal will consider premedication and plan high-resolution CT of the chest along with pulmonary consultation.  2. Shortness of breath: Likely related to emphysema versus acute pulmonary embolism check VQ scan start Lovenox presumptively.  3. Cough:  Patient has a long history of gastroesophageal reflux disease causing cough will monitor closely treat with nebulizers and continue proton pump inhibitor.  4. Emphysema of 1 start treatment as noted above.  5. Dysphagia continue treatment as at home with proton pump inhibitor.  DVT prophylaxis:  Full dose anticoagulation Code Status: Full Family Communication: Spoke with patient and her husband he was present in the room on admission. All questions answered. Disposition Plan: Home Consults called: None Admission status: Observation   Lady Deutscher MD, FACP [facility] Pager [phone]  If 7PM-7AM, please contact night-coverage [email or web address] Password [facility]  05/02/2017, 6:11 PM

## 2017-05-02 NOTE — ED Notes (Signed)
Assumed care of patient from [place], [place]. Pt resting quietly. No distress. No complaints. Awaiting next troponin. Cardiac monitoring in place.

## 2017-05-02 NOTE — Plan of Care (Signed)
76 year old female who presents with one month of chest pain shortness breath. Comes to the ED complaining of cough productive with brown sputum. Describes chest pain as a pressure. EDP gave patient a heart score 4 & EDP feels high suspicion of PE. Patient had a reaction to contrast dye in the past and arrested. Will need to come to [facility] for VQ scan. Initial troponin negative.  On my review of the current and past EKG there is some new T-wave flattening in anterior lateral leads Patient reported is currently chest pain-free. Coronary origin of chest pain less likely than PE at this time due to lack of positive troponin after long-term symptoms. Requested: Lovenox for possible PE  On arrival: CT chest without contrast, VQ scan, DVT workup  Elwin Mocha MD

## 2017-05-02 NOTE — ED Notes (Signed)
Pt on auto VS  

## 2017-05-02 NOTE — ED Provider Notes (Signed)
[place] DEPT [facility] Provider Note   CSN: [id number] Arrival date & time: 05/02/17  4967     History   Chief Complaint Chief Complaint  Patient presents with  . Chest Pain    HPI Anita [name] is a 76 y.o. female.  The history is provided by the patient and medical records. No language interpreter was used.  Chest Pain   This is a new problem. The current episode started more than 1 week ago. The problem occurs constantly. The problem has been gradually worsening. The pain is associated with coughing. The pain is present in the substernal region. The pain is at a severity of 5/10. The pain is moderate. The quality of the pain is described as pressure-like and sharp. The pain radiates to the upper back. Associated symptoms include cough, nausea, shortness of breath and sputum production. Pertinent negatives include no abdominal pain, no back pain, no fever, no headaches, no malaise/fatigue, no numbness, no palpitations, no syncope and no vomiting. She has tried nothing for the symptoms. The treatment provided no relief.    Past Medical History:  Diagnosis Date  . Allergy    seasonal  . Cough   . Diverticulitis   . Esophageal candidiasis ([place]) - treated 03/13/2016  . Esophageal stricture   . GERD (gastroesophageal reflux disease)   . IBS (irritable bowel syndrome)   . Kidney stones    right kidney  . Thyroid disease     Patient Active Problem List   Diagnosis Date Noted  . Esophageal candidiasis ([place]) - treated 03/13/2016  . Dysphagia, pharyngoesophageal phase 09/20/2015  . Upper airway cough syndrome 09/20/2015    Past Surgical History:  Procedure Laterality Date  . COLONOSCOPY    . ESOPHAGEAL DILATION    . FACIAL RECONSTRUCTION SURGERY    . HYSTEROTOMY    . lipoma right shoulder removal    . THYROIDECTOMY, PARTIAL    . TUBAL LIGATION    . UPPER GASTROINTESTINAL ENDOSCOPY      OB History    No data available       Home Medications    Prior to  Admission medications   Medication Sig Start Date End Date Taking? Authorizing Provider  levalbuterol [facility] HFA) 45 MCG/ACT inhaler Inhale 1-2 puffs into the lungs every 4 (four) hours as needed for wheezing. 01/13/17  Yes Lysbeth Penner, FNP  levothyroxine (SYNTHROID, LEVOTHROID) 88 MCG tablet Take 88 mcg by mouth daily before breakfast.   Yes [provider]  omeprazole (PRILOSEC) 40 MG capsule Take 40 mg by mouth daily. 10/06/16  Yes [provider]  Spacer/Aero Chamber Mouthpiece MISC 1 Device by Does not apply route as directed. 01/13/17  Yes Lysbeth Penner, FNP  prednisoLONE (PRELONE) 15 MG/5ML syrup Take 68ml po bid x 2 days then 7.5 ml po qd x 2 days then 62ml po qd x 4 days 03/29/17   Kinnie Feil, MD    Family History Family History  Problem Relation Age of Onset  . Heart disease Mother   . Diabetes Mother   . Kidney disease Father   . Breast cancer Sister   . Breast cancer Maternal Aunt   . Liver cancer Maternal Aunt   . Diabetes Maternal Aunt   . Liver cancer Paternal Uncle   . Breast cancer Maternal Grandmother   . Diabetes Maternal Grandmother   . Colon cancer Neg Hx   . Stomach cancer Neg Hx   . Esophageal cancer Neg Hx   .  Pancreatic cancer Neg Hx   . Rectal cancer Neg Hx     Social History Social History  Substance Use Topics  . Smoking status: Former Smoker    Packs/day: 1.00    Years: 15.00    Types: Cigarettes    Quit date: 10/21/1975  . Smokeless tobacco: Never Used  . Alcohol use 1.2 oz/week    2 Glasses of wine per week     Comment: 1-2 glasses of wine a week     Allergies   Azithromycin; Codeine; Minocin [minocycline]; Peanut-containing drug products; Penicillins; Sulfa antibiotics; Sulfacetamide sodium; Iohexol; Latex; and Pseudoephedrine-naproxen na er   Review of Systems Review of Systems  Constitutional: Negative for chills, fatigue, fever and malaise/fatigue.  HENT: Negative for congestion and rhinorrhea.     Respiratory: Positive for cough, sputum production and shortness of breath. Negative for chest tightness and wheezing.   Cardiovascular: Positive for chest pain. Negative for palpitations and syncope.  Gastrointestinal: Positive for nausea. Negative for abdominal pain, constipation, diarrhea and vomiting.  Genitourinary: Negative for dysuria.  Musculoskeletal: Negative for back pain, neck pain and neck stiffness.  Skin: Negative for rash and wound.  Neurological: Negative for light-headedness, numbness and headaches.  Psychiatric/Behavioral: Negative for agitation and confusion.  All other systems reviewed and are negative.    Physical Exam Updated Vital Signs BP 112/74   Pulse 75   Temp 98.2 F (36.8 C) (Oral)   Resp 10   Ht 5\' 1"  (1.549 m)   Wt 52.2 kg (115 lb)   SpO2 100%   BMI 21.73 kg/m   Physical Exam  Constitutional: She appears well-developed and well-nourished. No distress.  HENT:  Head: Normocephalic and atraumatic.  Mouth/Throat: Oropharynx is clear and moist. No oropharyngeal exudate.  Eyes: Pupils are equal, round, and reactive to light. Conjunctivae and EOM are normal.  Neck: Normal range of motion. Neck supple.  Cardiovascular: Normal rate and intact distal pulses.   No murmur heard. Pulmonary/Chest: Effort normal and breath sounds normal. No stridor. No respiratory distress. She has no wheezes. She exhibits tenderness.  Abdominal: Soft. There is no tenderness.  Musculoskeletal: She exhibits no edema or tenderness.  Neurological: She is alert. No sensory deficit.  Skin: Skin is warm and dry. Capillary refill takes less than 2 seconds. No rash noted. She is not diaphoretic.  Psychiatric: She has a normal mood and affect.  Nursing note and vitals reviewed.    ED Treatments / Results  Labs (all labs ordered are listed, but only abnormal results are displayed) Labs Reviewed  CBC WITH DIFFERENTIAL/PLATELET - Abnormal; Notable for the following:        Result Value   Hemoglobin 15.3 (*)    Eosinophils Absolute 1.4 (*)    All other components within normal limits  COMPREHENSIVE METABOLIC PANEL - Abnormal; Notable for the following:    Glucose, Bld 105 (*)    All other components within normal limits  D-DIMER, QUANTITATIVE (NOT AT [facility]) - Abnormal; Notable for the following:    D-Dimer, Quant 2.12 (*)    All other components within normal limits  TROPONIN I  TROPONIN I  BASIC METABOLIC PANEL  CBC  HEMOGLOBIN A1C  TSH    EKG  EKG Interpretation  Date/Time:  Saturday May 02 2017 09:35:17 EDT Ventricular Rate:  76 PR Interval:    QRS Duration: 85 QT Interval:  398 QTC Calculation: 448 R Axis:   24 Text Interpretation:  Sinus rhythm Biatrial enlargement Nonspecific T abnrm, anterolateral leads  When compred to prior, no significant changes seen.  no STEMI Confirmed by Antony Blackbird [phone]) on 05/02/2017 9:43:46 AM       Radiology Dg Chest 2 View  Result Date: 05/02/2017 CLINICAL DATA:  76 year-old female c/o SOB and chest pain x 1 month, with pain to back. Was eval on 04/23/17 for same at [facility]. Also c/o of cough and ongoing problems with dysphagia with strictureHx of GERD EXAM: CHEST  2 VIEW COMPARISON:  01/30/2017 FINDINGS: Cardiac silhouette is normal in size. No mediastinal or hilar masses. No evidence of adenopathy. Lungs are hyperexpanded but clear. No pleural effusion or pneumothorax. Skeletal structures are demineralized but intact. IMPRESSION: No acute cardiopulmonary disease. Electronically Signed   By: Lajean Manes M.D.   On: 05/02/2017 10:10    Procedures Procedures (including critical care time)  Medications Ordered in ED Medications  gi cocktail (Maalox,Lidocaine,Donnatal) (30 mLs Oral Given 05/02/17 1030)  albuterol (PROVENTIL) (2.5 MG/3ML) 0.083% nebulizer solution 2.5 mg (2.5 mg Nebulization Given 05/02/17 1033)     Initial Impression / Assessment and Plan / ED Course  I have reviewed the triage  vital signs and the nursing notes.  Pertinent labs & imaging results that were available during my care of the patient were reviewed by me and considered in my medical decision making (see chart for details).     Anita [name] is a 76 y.o. female with a past medical history significant for thyroid disease, GERD, esophageal stricture status post multiple dilations and chronic dysphasia who presents with chest pain, productive cough, and shortness of breath. Patient reports that for the last 2 weeks, she has had continued cough and shortness of breath. She reports coughing up brown chunks of sputum. She did not think they look like blood. Patient reports chest pain and shortness of breath that has worsened over the last few days. She describes her pain as a 5 out of 10 severity across her central and left chest. It is aching. It radiates around her chest towards her back. She denies any exertional component but it is worse with deep breathing. Patient reports that she was seen for a shortness of breath episode last week and was given a steroid shot which mildly helped her symptoms. She reports that she usually feels better with a liquid prednisone prescription but this was not given to her last time. She describes her pain as aching and tightness. She reports that she had wheezing earlier but that has slightly improved. She denies fevers or chills. She denies chest trauma. She denies vomiting but does report nausea. She denies diaphoresis. She denies leg pain or leg swelling. She denies any history of blood clots.  History and exam are seen above. Patient does have mild tenderness in her left central chest. Patient's lungs had very mild wheezing bilaterally. Patient's back was nontender. No CVA tenderness. Abdomen nontender. No lower extremity edema.  Given patient's age, brown productive sputum, chest pain, and shortness of breath, d-dimer was added to look for pulmonary embolism as cause of symptoms.  Patient also had a chest x-ray EKG and screening laboratory testing.  Initial EKG shows no significant change from prior with some nonspecific T-wave abnormalities. Initial lab testing also shows negative troponin, no leukocytosis, and reassuring metabolic panel d-dimer is elevated at 2.12.  Chart review shows that patient has a respiratory arrest allergy to contrast media. Thus, patient will not be appropriate for a CT PE study.  Heart Score calculated as a 4.  Given the patient's high risk chest pain and concern for pulmonary embolism with elevated d-dimer and symptoms, patient will likely need VQ scan.  PT continued to have intermittent CP. Pt will be admitted to [facility] for Chest pain workup and VQ scan. Lovenox ordered at request of admitting team.   Pt admitted in stable condition.    Final Clinical Impressions(s) / ED Diagnoses   Final diagnoses:  Precordial chest pain  Positive D dimer  Cough  Shortness of breath    Clinical Impression: 1. Precordial chest pain   2. Positive D dimer   3. Cough   4. Shortness of breath   5. Chest pain     Disposition: Admit to Hospitalist service    Meyli Boice, Gwenyth Allegra, MD 05/02/17 1929

## 2017-05-03 ENCOUNTER — Observation Stay (HOSPITAL_COMMUNITY): Payer: PPO

## 2017-05-03 DIAGNOSIS — R0602 Shortness of breath: Secondary | ICD-10-CM | POA: Diagnosis not present

## 2017-05-03 DIAGNOSIS — R7989 Other specified abnormal findings of blood chemistry: Secondary | ICD-10-CM

## 2017-05-03 DIAGNOSIS — J439 Emphysema, unspecified: Secondary | ICD-10-CM

## 2017-05-03 DIAGNOSIS — R1314 Dysphagia, pharyngoesophageal phase: Secondary | ICD-10-CM

## 2017-05-03 DIAGNOSIS — R072 Precordial pain: Principal | ICD-10-CM

## 2017-05-03 LAB — BASIC METABOLIC PANEL
Anion gap: 8 (ref 5–15)
BUN: 6 mg/dL (ref 6–20)
CALCIUM: 8.7 mg/dL — AB (ref 8.9–10.3)
CO2: 25 mmol/L (ref 22–32)
CREATININE: 0.74 mg/dL (ref 0.44–1.00)
Chloride: 107 mmol/L (ref 101–111)
GFR calc Af Amer: >60 mL/min (ref >60)
Glucose, Bld: 94 mg/dL (ref 65–99)
POTASSIUM: 3.8 mmol/L (ref 3.5–5.1)
SODIUM: 140 mmol/L (ref 135–145)

## 2017-05-03 LAB — CBC
HCT: 42.9 % (ref 36.0–46.0)
Hemoglobin: 14.3 g/dL (ref 12.0–15.0)
MCH: 30.1 pg (ref 26.0–34.0)
MCHC: 33.3 g/dL (ref 30.0–36.0)
MCV: 90.3 fL (ref 78.0–100.0)
PLATELETS: 315 10*3/uL (ref 150–400)
RBC: 4.75 MIL/uL (ref 3.87–5.11)
RDW: 13 % (ref 11.5–15.5)
WBC: 9.4 10*3/uL (ref 4.0–10.5)

## 2017-05-03 LAB — TSH: TSH: 3.78 u[IU]/mL (ref 0.350–4.500)

## 2017-05-03 MED ORDER — TECHNETIUM TC 99M DIETHYLENETRIAME-PENTAACETIC ACID: 31.7000 | Freq: Once | INTRAVENOUS | Status: DC | PRN

## 2017-05-03 MED ORDER — TECHNETIUM TO 99M ALBUMIN AGGREGATED
4.1000 | Freq: Once | INTRAVENOUS | Status: AC | PRN
  Administered 2017-05-03: 4.1 via INTRAVENOUS

## 2017-05-03 MED ORDER — PREDNISONE 5 MG/5ML PO SOLN: 20.0000 mg | Freq: Every day | ORAL | 0 refills | Status: DC

## 2017-05-03 NOTE — Plan of Care (Signed)
Problem: Pain Managment: Goal: General experience of comfort will improve Outcome: Progressing No c/o pain or discomfort this shift.  Encouraged to notify the nurse when in pain

## 2017-05-03 NOTE — Discharge Summary (Signed)
Physician Discharge Summary  Anita [name] LFY:[id number] DOB: [birth year] DOA: 05/02/2017  PCP: Delilah Shan, MD  Admit date: 05/02/2017 Discharge date: 05/04/2017  Admitted From: Home Disposition: Home   Recommendations for Outpatient Follow-up:  1. Follow up with PCP in 1-2 weeks 2. Follow up with GI for consideration of repeat endoscopy for what sounds like reflux and dysphagia with history of esophageal stenosis. Continue PPI and lifestyle modifications.   Home Health: None Equipment/Devices: None Discharge Condition: Stable CODE STATUS: Full Diet recommendation: As tolerated  Brief/Interim Summary: Anita [name] is a 76 y.o. female with medical history significant of emphysema (Noted on CT scan in 2011), cough, diverticulitis, severe GERD, IBS and recurrent shortness of breath and chest pains. The patient presents with one month of chest pain shortness breath. Comes to the ED complaining of cough productive with brown sputum. Describes chest pain as a pressure. EDP gave patient a heart score 4 & EDP feels high suspicion of PE. Due to history of cardiac arrest with IV dye, a V/Q scan was completed and was low probability of PE. Her breathing status improved and cardiac enzymes and ECG remained nonischemic. She derived benefit from breathing treatments and wheezing was documented, so a burst of prednisone is provided at discharge.  Discharge Diagnoses:  Principal Problem:   Chest pain Active Problems:   Dysphagia, pharyngoesophageal phase   Shortness of breath   Cough   Emphysema of lung (HCC)   Positive D dimer   Precordial chest pain  Chest pain, dyspnea: Suspect primarily due to emphysema and GERD. ACS and PE ruled out.  - Follow up with GI - Continue PPI for chronic cough - Prednisone burst for emphysema  Discharge Instructions Discharge Instructions    Discharge instructions    Complete by:  As directed    Your symptoms were likely due to emphysema, as seen on  the CT of your chest. There was no evidence of pneumonia, blood clot, or heart failure. You also have symptoms related to esophageal narrowing which should be treated with continuing your home medications and following up with Dr. Carlean Purl.  - Start taking prednisone 20mg  in the morning for 5 mornings to help with emphysema - Continue using other home medications as directed.  - Follow up with Dr. Claiborne Billings, or seek medical attention sooner if you notice worsening chest pain or trouble breathing.     Allergies as of 05/03/2017      Reactions   Azithromycin Swelling   Airway swelling, difficult breathing, stomach swelling   Codeine Anaphylaxis   Minocin [minocycline] Anaphylaxis   Peanut-containing Drug Products Anaphylaxis   Penicillins Anaphylaxis   Has patient had a PCN reaction causing immediate rash, facial/tongue/throat swelling, SOB or lightheadedness with hypotension: YES Has patient had a PCN reaction causing severe rash involving mucus membranes or skin nec  Has patient had a PCN reaction that required hospitalization NO Has patient had a PCN reaction occurring within the last 10 years: NO If all of the above answers are "NO", then may proceed with Cephalosporin use.   Sulfa Antibiotics Anaphylaxis   Sulfacetamide Sodium Anaphylaxis   Iohexol Other (See Comments)    Desc: ALLERGIC TO IV CONTRAST-- RESPIRATORY ARREST   Latex Swelling   Pseudoephedrine-naproxen Na Er Other (See Comments)   Quit breathing      Medication List    TAKE these medications   aspirin EC 325 MG tablet Take 325 mg by mouth every 6 (six) hours as needed (pain/headache).  aspirin 81 MG chewable tablet Chew 81 mg by mouth daily as needed for headache.   estradiol 0.1 MG/GM vaginal cream Commonly known as:  ESTRACE Place 0.5 g vaginally daily as needed (vaginal dryness/irritation).   levalbuterol 45 MCG/ACT inhaler Commonly known as:  XOPENEX HFA Inhale 1-2 puffs into the lungs every 4 (four) hours as  needed for wheezing.   levothyroxine 88 MCG tablet Commonly known as:  SYNTHROID, LEVOTHROID Take 88 mcg by mouth See admin instructions. Take 1 tablet (88 mcg) by mouth with applesauce every morning   omeprazole 40 MG capsule Commonly known as:  PRILOSEC Take 40 mg by mouth See admin instructions. Open capsule and sprinkle over applesauce once daily   predniSONE 5 MG/5ML solution Take 20 mLs (20 mg total) by mouth daily with breakfast. for 5 days   Spacer/Aero Chamber Mouthpiece Misc 1 Device by Does not apply route as directed.      Follow-up Information    Delilah Shan, MD Follow up.   Specialty:  Family Medicine Contact information: [address] [facility] [place] [address] [phone]        Gatha Mayer, MD Follow up.   Specialty:  Gastroenterology Contact information: [address] [address] [phone]          Allergies  Allergen Reactions  . Azithromycin Swelling    Airway swelling, difficult breathing, stomach swelling  . Codeine Anaphylaxis  . Minocin [Minocycline] Anaphylaxis  . Peanut-Containing Drug Products Anaphylaxis  . Penicillins Anaphylaxis    Has patient had a PCN reaction causing immediate rash, facial/tongue/throat swelling, SOB or lightheadedness with hypotension: YES Has patient had a PCN reaction causing severe rash involving mucus membranes or skin nec  Has patient had a PCN reaction that required hospitalization NO Has patient had a PCN reaction occurring within the last 10 years: NO If all of the above answers are "NO", then may proceed with Cephalosporin use.  . Sulfa Antibiotics Anaphylaxis  . Sulfacetamide Sodium Anaphylaxis  . Iohexol Other (See Comments)     Desc: ALLERGIC TO IV CONTRAST-- RESPIRATORY ARREST   . Latex Swelling  . Pseudoephedrine-Naproxen Na Er Other (See Comments)    Quit breathing    Consultations:  None  Procedures/Studies: Dg Chest 2  View  Result Date: 05/02/2017 CLINICAL DATA:  76 year-old female c/o SOB and chest pain x 1 month, with pain to back. Was eval on 04/23/17 for same at [facility]. Also c/o of cough and ongoing problems with dysphagia with strictureHx of GERD EXAM: CHEST  2 VIEW COMPARISON:  01/30/2017 FINDINGS: Cardiac silhouette is normal in size. No mediastinal or hilar masses. No evidence of adenopathy. Lungs are hyperexpanded but clear. No pleural effusion or pneumothorax. Skeletal structures are demineralized but intact. IMPRESSION: No acute cardiopulmonary disease. Electronically Signed   By: Lajean Manes M.D.   On: 05/02/2017 10:10   Ct Chest Wo Contrast  Result Date: 05/02/2017 CLINICAL DATA:  Cough and shortness of breath EXAM: CT CHEST WITHOUT CONTRAST TECHNIQUE: Multidetector CT imaging of the chest was performed following the standard protocol without IV contrast. COMPARISON:  05/02/2017 radiograph FINDINGS: Cardiovascular: Limited evaluation without intravenous contrast. Non aneurysmal aorta. Aortic atherosclerosis. Coronary artery calcification. Normal heart size. No large pericardial effusion Mediastinum/Nodes: Mildly enlarged mediastinal lymph nodes, for example pretracheal lymph node measures 7 mm. AP window lymph node measures up to 9 mm. Limited evaluation for hilar adenopathy. Surgical clips near the right lobe  of thyroid. Midline trachea. Esophagus within normal limits. Lungs/Pleura: Mild to moderate emphysema. No acute consolidation or pleural effusion. Apical pleural and parenchymal scarring. 3 mm right middle lobe pulmonary nodule. Tiny 1 mm nodular foci subpleural left upper lobe. Upper Abdomen: No acute abnormality. Musculoskeletal: No acute or suspicious bone lesion. IMPRESSION: 1. Mild to moderate emphysema.  No focal consolidation. 2. Faint tiny nodular foci in the subpleural left upper lobe, possibly due to mild respiratory infection/bronchiolitis. 3. 3 mm right middle lobe pulmonary nodule.  No follow-up needed if patient is low-risk. Non-contrast chest CT can be considered in 12 months if patient is high-risk. This recommendation follows the consensus statement: Guidelines for Management of Incidental Pulmonary Nodules Detected on CT Images: From the Fleischner Society 2017; Radiology 2017; 284:228-243. 4. Mild mediastinal adenopathy, nonspecific Aortic Atherosclerosis (ICD10-I70.0) and Emphysema (ICD10-J43.9). Electronically Signed   By: Donavan Foil M.D.   On: 05/02/2017 20:22   Nm Pulmonary Perf And Vent  Result Date: 05/03/2017 CLINICAL DATA:  Cough, former smoker, COPD/ emphysema, 1 month history of chest pain and shortness of breath, chest pressure, bringing up brown sputum, elevated D-dimer, former smoker EXAM: NUCLEAR MEDICINE VENTILATION - PERFUSION LUNG SCAN TECHNIQUE: Ventilation images were obtained in multiple projections using inhaled aerosol Tc-3m DTPA. Perfusion images were obtained in multiple projections after intravenous injection of Tc-15m MAA. RADIOPHARMACEUTICALS:  31.7 mCi Technetium-36m DTPA aerosol inhalation and 4.1 mCi Technetium-4m MAA IV COMPARISON:  None Radiographic correlation: Chest radiograph 05/02/2017, CT chest 05/02/2017 FINDINGS: Ventilation: Patchy ventilation in the periphery of both lungs including subsegmental perfusion defects in BILATERAL upper lobes, RIGHT middle lobe, both lower lobes. Perfusion: Subsegmental perfusion defect lateral RIGHT upper lobe matching ventilation finding. Matching large subsegmental perfusion defect in LEFT lower lobe. Better perfusion than ventilation in LEFT upper lobe. Chest radiograph:  Emphysematous changes without infiltrate IMPRESSION: Low probability for pulmonary embolism. Electronically Signed   By: Lavonia Dana M.D.   On: 05/03/2017 10:45   Subjective: Pt subjectively better this morning after breathing treatments. States prednisone always helps these symptoms. Has had left chest pain that is worse when she  presses on that spot for several weeks now, no worse with exertion. No leg swelling or pain.   Discharge Exam: BP (!) 107/54 (BP Location: Right Arm)   Pulse 82   Temp (!) 97.4 F (36.3 C) (Oral)   Resp 15   Ht 5' 1.5" (1.562 m)   Wt 50.9 kg (112 lb 3.2 oz)   SpO2 93%   BMI 20.86 kg/m   General: Pt is alert, awake, not in acute distress Cardiovascular: RRR, no murmur, no JVD or LE edema Respiratory: Diminished, no wheezing just after breathing treatment, nonlabored on room air Abdominal: Soft, NT, ND, bowel sounds +  Labs: Basic Metabolic Panel:  Recent Labs Lab 05/02/17 0942 05/03/17 0250  NA 138 140  K 4.3 3.8  CL 102 107  CO2 27 25  GLUCOSE 105* 94  BUN 9 6  CREATININE 0.77 0.74  CALCIUM 9.4 8.7*   Liver Function Tests:  Recent Labs Lab 05/02/17 0942  AST 21  ALT 15  ALKPHOS 96  BILITOT 0.4  PROT 7.5  ALBUMIN 4.2   CBC:  Recent Labs Lab 05/02/17 0942 05/03/17 0250  WBC 9.8 9.4  NEUTROABS 4.5  --   HGB 15.3* 14.3  HCT 45.0 42.9  MCV 89.1 90.3  PLT 346 315   Cardiac Enzymes:  Recent Labs Lab 05/02/17 0942 05/02/17 1228  TROPONINI <0.03 <0.03  D-Dimer  Recent Labs  05/02/17 0942  DDIMER 2.12*   Thyroid function studies  Recent Labs  05/03/17 0250  TSH 3.780    Time coordinating discharge: Approximately 40 minutes  Vance Gather, MD  [facility] 05/04/2017, 4:30 PM Pager [phone]

## 2017-05-05 ENCOUNTER — Telehealth: Payer: Self-pay | Admitting: Internal Medicine

## 2017-05-05 LAB — HEMOGLOBIN A1C
HEMOGLOBIN A1C: 5.6 % (ref 4.8–5.6)
MEAN PLASMA GLUCOSE: 114 mg/dL

## 2017-05-05 NOTE — Telephone Encounter (Signed)
Left message for patient to call back  

## 2017-05-06 DIAGNOSIS — R1314 Dysphagia, pharyngoesophageal phase: Secondary | ICD-10-CM | POA: Diagnosis not present

## 2017-05-06 DIAGNOSIS — J439 Emphysema, unspecified: Secondary | ICD-10-CM | POA: Diagnosis not present

## 2017-05-06 NOTE — Telephone Encounter (Signed)
Patient with dysphagia.  She was in the ED over the weekend because she could not swallow water.  She was diagnosed with emphysema.  Per hospital notes they thought some of the CP and SOB she is having are related to GERD.  She will come in and see Alonza Bogus, PA on 05/13/17

## 2017-05-13 ENCOUNTER — Ambulatory Visit (INDEPENDENT_AMBULATORY_CARE_PROVIDER_SITE_OTHER): Payer: PPO | Admitting: Gastroenterology

## 2017-05-13 ENCOUNTER — Encounter: Payer: Self-pay | Admitting: Gastroenterology

## 2017-05-13 VITALS — BP 122/60 | HR 72 | Ht 62.5 in | Wt 113.5 lb

## 2017-05-13 DIAGNOSIS — R1319 Other dysphagia: Secondary | ICD-10-CM

## 2017-05-13 NOTE — Patient Instructions (Signed)
You have been scheduled for a Barium Esophogram at [facility] Radiology (1st floor of the hospital) on Wednesday 05-20-2017 at 1:30 PM. Please arrive at 1:15 pm  to your appointment for registration. Make certain not to have anything to eat or drink 3 hours prior to your test. If you need to reschedule for any reason, please contact radiology at [phone] to do so. Nothing by mouth after 10:30 am.  __________________________________________________________________ A barium swallow is an examination that concentrates on views of the esophagus. This tends to be a double contrast exam (barium and two liquids which, when combined, create a gas to distend the wall of the oesophagus) or single contrast (non-ionic iodine based). The study is usually tailored to your symptoms so a good history is essential. Attention is paid during the study to the form, structure and configuration of the esophagus, looking for functional disorders (such as aspiration, dysphagia, achalasia, motility and reflux) EXAMINATION You may be asked to change into a gown, depending on the type of swallow being performed. A radiologist and radiographer will perform the procedure. The radiologist will advise you of the type of contrast selected for your procedure and direct you during the exam. You will be asked to stand, sit or lie in several different positions and to hold a small amount of fluid in your mouth before being asked to swallow while the imaging is performed .In some instances you may be asked to swallow barium coated marshmallows to assess the motility of a solid food bolus. The exam can be recorded as a digital or video fluoroscopy procedure. POST PROCEDURE It will take 1-2 days for the barium to pass through your system. To facilitate this, it is important, unless otherwise directed, to increase your fluids for the next 24-48hrs and to resume your normal diet.  This test typically takes about 30 minutes to  perform. __________________________________________________________________________________

## 2017-05-13 NOTE — Progress Notes (Addendum)
2/44/0102 Anita [name] [id number] 11-23-1940   HISTORY OF PRESENT ILLNESS:  This is a 76 year old female who is well-known to Dr. Carlean [name] for complaints of dysphagia. It seems that she suffers from this dysphasia and then when things get hung up in her esophagus that she has increased reflux issues which then cause her to cough and exacerbate breathing problems, etc. She says that the only thing that helps her swallowing is prednisone, but I believe that she has been placed on this for breathing issues. She is on omeprazole 40 mg daily. Her last EGD was in May 2017 at which time she was found to have monilial esophagitis and gastritis. She was to treat with Diflucan for 3 weeks at that time. She also follows with Dr. Joya [name], who is an otolaryngologist at [facility]. It appears that both he and Dr. Carlean [name] think that her dysphagia is a motility problem, but she presumably will not tolerate a manometry study.  She was recently in the hospital for 24 hours with complaints of shortness of breath and chest pain, which they contributed to emphysema and GERD after other extensive evaluation. Once again they placed her on steroids and she is improving, convinced that the steroids help her swallowing.   Past Medical History:  Diagnosis Date  . Allergy    seasonal  . Cough   . Diverticulitis   . Esophageal candidiasis ([place]) - treated 03/13/2016  . Esophageal stricture   . GERD (gastroesophageal reflux disease)   . IBS (irritable bowel syndrome)   . Kidney stones    right kidney  . Thyroid disease    Past Surgical History:  Procedure Laterality Date  . COLONOSCOPY    . ESOPHAGEAL DILATION    . FACIAL RECONSTRUCTION SURGERY    . HYSTEROTOMY    . lipoma right shoulder removal    . THYROIDECTOMY, PARTIAL    . TUBAL LIGATION    . UPPER GASTROINTESTINAL ENDOSCOPY      reports that she quit smoking about 41 years ago. Her smoking use included Cigarettes. She has a 15.00 pack-year smoking  history. She has never used smokeless tobacco. She reports that she drinks about 1.2 oz of alcohol per week . She reports that she does not use drugs. family history includes Breast cancer in her maternal aunt, maternal grandmother, and sister; Diabetes in her maternal aunt, maternal grandmother, and mother; Heart disease in her mother; Kidney disease in her father; Liver cancer in her maternal aunt and paternal uncle. Allergies  Allergen Reactions  . Azithromycin Swelling    Airway swelling, difficult breathing, stomach swelling  . Codeine Anaphylaxis  . Minocin [Minocycline] Anaphylaxis  . Peanut-Containing Drug Products Anaphylaxis  . Penicillins Anaphylaxis    Has patient had a PCN reaction causing immediate rash, facial/tongue/throat swelling, SOB or lightheadedness with hypotension: YES Has patient had a PCN reaction causing severe rash involving mucus membranes or skin nec  Has patient had a PCN reaction that required hospitalization NO Has patient had a PCN reaction occurring within the last 10 years: NO If all of the above answers are "NO", then may proceed with Cephalosporin use.  . Sulfa Antibiotics Anaphylaxis  . Sulfacetamide Sodium Anaphylaxis  . Iohexol Other (See Comments)     Desc: ALLERGIC TO IV CONTRAST-- RESPIRATORY ARREST   . Latex Swelling  . Pseudoephedrine-Naproxen Na Er Other (See Comments)    Quit breathing      Outpatient Encounter Prescriptions as of 05/13/2017  Medication Sig  .  aspirin EC 325 MG tablet Take 325 mg by mouth as needed (pain/headache).   . estradiol (ESTRACE) 0.1 MG/GM vaginal cream Place 0.5 g vaginally daily as needed (vaginal dryness/irritation).   Marland Kitchen levalbuterol (XOPENEX HFA) 45 MCG/ACT inhaler Inhale 1-2 puffs into the lungs every 4 (four) hours as needed for wheezing.  Marland Kitchen levothyroxine (SYNTHROID, LEVOTHROID) 88 MCG tablet Take 88 mcg by mouth See admin instructions. Take 1 tablet (88 mcg) by mouth with applesauce every morning  .  omeprazole (PRILOSEC) 40 MG capsule Take 40 mg by mouth See admin instructions. Open capsule and sprinkle over applesauce once daily  . prednisoLONE (PRELONE) 15 MG/5ML syrup TAKE 10 ML BY MOUTH TWICE DAILY X 5 DAYS.  . [DISCONTINUED] albuterol (PROVENTIL HFA;VENTOLIN HFA) 108 (90 Base) MCG/ACT inhaler Inhale into the lungs.  . [DISCONTINUED] aspirin 81 MG chewable tablet Chew 81 mg by mouth daily as needed for headache.  . [DISCONTINUED] predniSONE 5 MG/5ML solution Take 20 mLs (20 mg total) by mouth daily with breakfast. for 5 days  . [DISCONTINUED] Spacer/Aero Chamber Mouthpiece MISC 1 Device by Does not apply route as directed. (Patient not taking: Reported on 05/02/2017)   No facility-administered encounter medications on file as of 05/13/2017.      REVIEW OF SYSTEMS  : All other systems reviewed and negative except where noted in the History of Present Illness.   PHYSICAL EXAM: BP 122/60   Pulse 72   Ht 5' 2.5" (1.588 m)   Wt 113 lb 8 oz (51.5 kg)   BMI 20.43 kg/m  General: Well developed white female in no acute distress Head: Normocephalic and atraumatic Eyes:  Sclerae anicteric, conjunctiva pink. Ears: Normal auditory acuity Lungs: Clear throughout to auscultation; no increased WOB. Heart: Regular rate and rhythm Abdomen: Soft, non-distended. Normal bowel sounds.  Mild epigastric TTP. Musculoskeletal: Symmetrical with no gross deformities  Skin: No lesions on visible extremities Extremities: No edema  Neurological: Alert oriented x 4, grossly non-focal Psychological:  Alert and cooperative. Normal mood and affect  ASSESSMENT AND PLAN: -Dysphagia:  Thought to be dysmotility per Dr. Carlean [name] and Dr. Joya [name] (otolaryngologist at [facility]), but patient presumably will not tolerate a manometry study.  She was just seen by Dr. Joya [name] and it was recommended that she have an esophagram performed, but she has not yet done so.  I also think that is the best route to go for now.  Agree  with Ms. Anita [name] management.  Ba swallow showed IMPRESSION: 1. Poor relaxation of the cricopharyngeus muscle. 2. Questionable mild mucosal irregularity throughout the thoracic esophagus, such as due to recurrent or persistent Candidiasis. 3. Otherwise normal esophagram.  I think she could have a recurrence of cervical esophageal web and also Candida  Repeating EGD and dilation makes sense if she will.  Gatha Mayer, MD, [facility]  CC:  Delilah Shan, MD

## 2017-05-20 ENCOUNTER — Ambulatory Visit (HOSPITAL_COMMUNITY)
Admission: RE | Admit: 2017-05-20 | Discharge: 2017-05-20 | Disposition: A | Discharge status: Home / Self Care | Payer: PPO | Source: Ambulatory Visit | Attending: Gastroenterology | Admitting: Gastroenterology

## 2017-05-20 DIAGNOSIS — R1310 Dysphagia, unspecified: Secondary | ICD-10-CM | POA: Diagnosis not present

## 2017-05-20 DIAGNOSIS — R1319 Other dysphagia: Secondary | ICD-10-CM | POA: Diagnosis not present

## 2017-05-22 DIAGNOSIS — R310 Gross hematuria: Secondary | ICD-10-CM | POA: Diagnosis not present

## 2017-05-22 DIAGNOSIS — R399 Unspecified symptoms and signs involving the genitourinary system: Secondary | ICD-10-CM | POA: Diagnosis not present

## 2017-05-22 DIAGNOSIS — R319 Hematuria, unspecified: Secondary | ICD-10-CM | POA: Diagnosis not present

## 2017-06-02 ENCOUNTER — Encounter: Payer: Self-pay | Admitting: Internal Medicine

## 2017-06-02 ENCOUNTER — Ambulatory Visit (INDEPENDENT_AMBULATORY_CARE_PROVIDER_SITE_OTHER): Payer: PPO | Admitting: Internal Medicine

## 2017-06-02 VITALS — BP 96/56 | HR 60 | Ht 62.0 in | Wt 115.4 lb

## 2017-06-02 DIAGNOSIS — R1310 Dysphagia, unspecified: Secondary | ICD-10-CM

## 2017-06-02 DIAGNOSIS — Q394 Esophageal web: Secondary | ICD-10-CM

## 2017-06-02 NOTE — Progress Notes (Signed)
Anita [name] 76 y.o. 1941/09/26 [id number]  Assessment & Plan:   Encounter Diagnoses  Name Primary?  Marland Kitchen Dysphagia, unspecified type Yes  . Cricopharyngeal web    I think trying to dilate the UES area again makes sense - I did so in early 207 and she got some benefit - saw web in that area. Then a few months later Candida seen and Tx bit not much help.  Instead of [facility] dialtor possibly use balloon though that could be tricky high in esophagus/posterior pharynx  The risks and benefits as well as alternatives of endoscopic procedure(s) have been discussed and reviewed. All questions answered. The patient agrees to proceed.   Subjective:   Chief Complaint: dysphagia  HPI Here  For f/u still having intermittent dysphagia problems. Hospitalized last month with respiratory problems. Says she feels like that is coming on again - prednisolone made things right - thinks she needs that again. Recernt ba swallow showed incomplete relaxation of the cricopharyngeus and mucosal changes that suggest Candida esophagitis , o/w NL 05/20/2017 Allergies  Allergen Reactions  . Azithromycin Swelling    Airway swelling, difficult breathing, stomach swelling  . Codeine Anaphylaxis  . Minocin [Minocycline] Anaphylaxis  . Peanut-Containing Drug Products Anaphylaxis  . Penicillins Anaphylaxis    Has patient had a PCN reaction causing immediate rash, facial/tongue/throat swelling, SOB or lightheadedness with hypotension: YES Has patient had a PCN reaction causing severe rash involving mucus membranes or skin nec  Has patient had a PCN reaction that required hospitalization NO Has patient had a PCN reaction occurring within the last 10 years: NO If all of the above answers are "NO", then may proceed with Cephalosporin use.  . Sulfa Antibiotics Anaphylaxis  . Sulfacetamide Sodium Anaphylaxis  . Iohexol Other (See Comments)     Desc: ALLERGIC TO IV CONTRAST-- RESPIRATORY ARREST   . Latex Swelling    . Pseudoephedrine-Naproxen Na Er Other (See Comments)    Quit breathing   Current Meds  Medication Sig  . aspirin EC 325 MG tablet Take 325 mg by mouth as needed (pain/headache).   . estradiol (ESTRACE) 0.1 MG/GM vaginal cream Place 0.5 g vaginally daily as needed (vaginal dryness/irritation).   Marland Kitchen levalbuterol (XOPENEX HFA) 45 MCG/ACT inhaler Inhale 1-2 puffs into the lungs every 4 (four) hours as needed for wheezing.  Marland Kitchen levothyroxine (SYNTHROID, LEVOTHROID) 88 MCG tablet Take 88 mcg by mouth See admin instructions. Take 1 tablet (88 mcg) by mouth with applesauce every morning  . omeprazole (PRILOSEC) 40 MG capsule Take 40 mg by mouth See admin instructions. Open capsule and sprinkle over applesauce once daily  . prednisoLONE (PRELONE) 15 MG/5ML syrup TAKE 10 ML BY MOUTH TWICE DAILY X 5 DAYS.   Past Medical History:  Diagnosis Date  . Allergy    seasonal  . Cough   . Diverticulitis   . Esophageal candidiasis ([place]) - treated 03/13/2016  . Esophageal stricture   . GERD (gastroesophageal reflux disease)   . IBS (irritable bowel syndrome)   . Kidney stones    right kidney  . Thyroid disease    Past Surgical History:  Procedure Laterality Date  . COLONOSCOPY    . ESOPHAGEAL DILATION    . FACIAL RECONSTRUCTION SURGERY    . HYSTEROTOMY    . lipoma right shoulder removal    . THYROIDECTOMY, PARTIAL    . TUBAL LIGATION    . UPPER GASTROINTESTINAL ENDOSCOPY     family history includes Breast cancer in her maternal  aunt, maternal grandmother, and sister; Diabetes in her maternal aunt, maternal grandmother, and mother; Heart disease in her mother; Kidney disease in her father; Liver cancer in her maternal aunt and paternal uncle.   Review of Systems As above  Objective:   Physical Exam BP (!) 96/56   Pulse 60   Ht 5\' 2"  (1.575 m)   Wt 115 lb 6.4 oz (52.3 kg)   BMI 21.11 kg/m  NAD Lungs coarse BS good air mvt Ht s1s2 no rmg Pharynx clear Neck supple and no mass

## 2017-06-02 NOTE — Patient Instructions (Signed)
  You have been scheduled for an endoscopy. Please follow written instructions given to you at your visit today. If you use inhalers (even only as needed), please bring them with you on the day of your procedure.   I appreciate the opportunity to care for you. Carl Gessner, MD, FACG 

## 2017-06-04 DIAGNOSIS — J439 Emphysema, unspecified: Secondary | ICD-10-CM | POA: Diagnosis not present

## 2017-06-04 DIAGNOSIS — J209 Acute bronchitis, unspecified: Secondary | ICD-10-CM | POA: Diagnosis not present

## 2017-06-16 ENCOUNTER — Encounter: Payer: Self-pay | Admitting: Internal Medicine

## 2017-06-24 ENCOUNTER — Encounter: Payer: Self-pay | Admitting: Internal Medicine

## 2017-06-24 ENCOUNTER — Ambulatory Visit (AMBULATORY_SURGERY_CENTER): Payer: PPO | Admitting: Internal Medicine

## 2017-06-24 VITALS — BP 117/63 | HR 64 | Temp 98.0°F | Resp 17 | Ht 62.0 in | Wt 115.0 lb

## 2017-06-24 DIAGNOSIS — R1310 Dysphagia, unspecified: Secondary | ICD-10-CM | POA: Diagnosis not present

## 2017-06-24 DIAGNOSIS — B3781 Candidal esophagitis: Secondary | ICD-10-CM

## 2017-06-24 DIAGNOSIS — K222 Esophageal obstruction: Secondary | ICD-10-CM | POA: Diagnosis not present

## 2017-06-24 MED ORDER — SODIUM CHLORIDE 0.9 % IV SOLN: 500.0000 mL | INTRAVENOUS | Status: DC

## 2017-06-24 MED ORDER — FLUCONAZOLE 100 MG PO TABS: 100.0000 mg | ORAL_TABLET | Freq: Every day | ORAL | 0 refills | Status: DC

## 2017-06-24 MED ORDER — FLUCONAZOLE 10 MG/ML PO SUSR: 100.0000 mg | Freq: Every day | ORAL | 0 refills | Status: AC

## 2017-06-24 NOTE — Progress Notes (Signed)
Called to room to assist during endoscopic procedure.  Patient ID and intended procedure confirmed with present staff. Received instructions for my participation in the procedure from the performing physician.  

## 2017-06-24 NOTE — Op Note (Addendum)
[facility] Patient Name: Anita [name] Procedure Date: 06/24/2017 3:39 PM MRN: [id number] Endoscopist: Gatha Mayer , MD Age: 76 Referring MD:  Date of Birth: 1940/12/13 Gender: Female Account #: [id number] Procedure:                Upper GI endoscopy Indications:              Dysphagia, Stenosis of the esophagus Medicines:                Propofol per Anesthesia, Monitored Anesthesia Care Procedure:                Pre-Anesthesia Assessment:                           - Prior to the procedure, a History and Physical                            was performed, and patient medications and                            allergies were reviewed. The patient's tolerance of                            previous anesthesia was also reviewed. The risks                            and benefits of the procedure and the sedation                            options and risks were discussed with the patient.                            All questions were answered, and informed consent                            was obtained. Prior Anticoagulants: The patient has                            taken no previous anticoagulant or antiplatelet                            agents. ASA Grade Assessment: II - A patient with                            mild systemic disease. After reviewing the risks                            and benefits, the patient was deemed in                            satisfactory condition to undergo the procedure.                           After obtaining informed consent, the endoscope was  passed under direct vision. Throughout the                            procedure, the patient's blood pressure, pulse, and                            oxygen saturations were monitored continuously. The                            Endoscope was introduced through the mouth, and                            advanced to the antrum of the stomach. The upper GI       endoscopy was accomplished without difficulty. The                            patient tolerated the procedure well. The upper GI                            endoscopy was accomplished without difficulty. The                            patient tolerated the procedure well. Scope In: Scope Out: Findings:                 One mild benign-appearing, intrinsic stenosis was                            found at the cricopharyngeus. And was traversed.                            The scope was withdrawn. Dilation was performed                            with a Maloney dilator with mild resistance at 31                            Fr. The scope was withdrawn. Dilation was performed                            with a Maloney dilator with mild resistance at 72                            Fr. The dilation site was examined following                            endoscope reinsertion and showed moderate                            improvement in luminal narrowing. Estimated blood                            loss: none.  Diffuse candidiasis was found in the entire                            esophagus.                           The entire examined stomach was normal.                           The cardia and gastric fundus were normal on                            retroflexion. Complications:            No immediate complications. Estimated Blood Loss:     Estimated blood loss: none. Impression:               - Benign-appearing esophageal stenosis. Dilated.                           - Monilial esophagitis.                           - Normal stomach.                           - No specimens collected. Recommendation:           - Patient has a contact number available for                            emergencies. The signs and symptoms of potential                            delayed complications were discussed with the                            patient. Return to normal activities  tomorrow.                            Written discharge instructions were provided to the                            patient.                           - Clear liquids x 1 hour then soft foods rest of                            day. Start prior diet tomorrow.                           - Continue present medications.                           - Continue present medications.                           -  Return to my office PRN.                           - Treat Candidiasis with fluconazole - I think this                            is related to recent steroid use ? if could be part                            of dysphagia issues also                           - If this fails to work consider Botox injections                           Note had sore throat in recovery - not surprised                            and had last dilation - no endoscopic evidence for                            problem except slight submucosal heme in post                            pharynx from intial intubation of scope - I could                            tell she was sensitive in UES area then and it                            improved with deeper sedation Gatha Mayer, MD 06/24/2017 3:55:12 PM This report has been signed electronically.

## 2017-06-24 NOTE — Patient Instructions (Addendum)
I dilated the esophagus again - I hope this helps.  I also found some Candida infection in the esophagus - this is likely due to recent prednisolone use - it can cause some swallowing problems also so will treat it with an antibiotic called fluconazole.  Let me know if you do not get better - I hope this all works.  I appreciate the opportunity to care for you. Gatha Mayer, MD, FACG   YOU HAD AN ENDOSCOPIC PROCEDURE TODAY AT [facility]:   Refer to the procedure report that was given to you for any specific questions about what was found during the examination.  If the procedure report does not answer your questions, please call your gastroenterologist to clarify.  If you requested that your care partner not be given the details of your procedure findings, then the procedure report has been included in a sealed envelope for you to review at your convenience later.  YOU SHOULD EXPECT: Some feelings of bloating in the abdomen. Passage of more gas than usual.  Walking can help get rid of the air that was put into your GI tract during the procedure and reduce the bloating. If you had a lower endoscopy (such as a colonoscopy or flexible sigmoidoscopy) you may notice spotting of blood in your stool or on the toilet paper. If you underwent a bowel prep for your procedure, you may not have a normal bowel movement for a few days.  Please Note:  You might notice some irritation and congestion in your nose or some drainage.  This is from the oxygen used during your procedure.  There is no need for concern and it should clear up in a day or so.  SYMPTOMS TO REPORT IMMEDIATELY:     Following upper endoscopy (EGD)  Vomiting of blood or coffee ground material  New chest pain or pain under the shoulder blades  Painful or persistently difficult swallowing  New shortness of breath  Fever of 100F or higher  Black, tarry-looking stools  For urgent or emergent issues, a  gastroenterologist can be reached at any hour by calling [phone].   DIET:  Please follow the dilatation diet the rest of today.  Handout was given to your care partner. Drink plenty of fluids but you should avoid alcoholic beverages for 24 hours.  ACTIVITY:  You should plan to take it easy for the rest of today and you should NOT DRIVE or use heavy machinery until tomorrow (because of the sedation medicines used during the test).    FOLLOW UP: Our staff will call the number listed on your records the next business day following your procedure to check on you and address any questions or concerns that you may have regarding the information given to you following your procedure. If we do not reach you, we will leave a message.  However, if you are feeling well and you are not experiencing any problems, there is no need to return our call.  We will assume that you have returned to your regular daily activities without incident.  If any biopsies were taken you will be contacted by phone or by letter within the next 1-3 weeks.  Please call us at [phone] if you have not heard about the biopsies in 3 weeks.    SIGNATURES/CONFIDENTIALITY: You and/or your care partner have signed paperwork which will be entered into your electronic medical record.  These signatures attest to the fact that that the information  above on your After Visit Summary has been reviewed and is understood.  Full responsibility of the confidentiality of this discharge information lies with you and/or your care-partner.    Handouts were given to your care partner on the dilatation diet to follow the rest of today. Rx was sent to your pharmacy for fluconazole. You may resume your current medications today. Please call if any questions or concerns.

## 2017-06-24 NOTE — Progress Notes (Signed)
No problems noted in the recovery room. maw 

## 2017-06-24 NOTE — Progress Notes (Signed)
Pt stated she could not take med in pill form.  I aksed Dr. Carlean Purl if he could send in fluconazole in liquid form.  I called [facility] and spoke with Caryl Pina, asked her to cancel Fluconzole pill and fill the liquid rx.  Also pt c/o a sore throat while in the recovery room.  Dr. Nita Sickle told pt this was normal and expected.  I recommended she try warm water gargles and OTC lozenger to help sooth her throat.  A scatchy sore throat for a few days is normal.  To call if sx do not resolve. maw

## 2017-06-24 NOTE — Progress Notes (Signed)
To PACU, VSS. Report to [email or web address] 

## 2017-06-25 ENCOUNTER — Telehealth: Payer: Self-pay | Admitting: *Deleted

## 2017-06-25 NOTE — Telephone Encounter (Signed)
  Follow up Call-  Call back number 06/24/2017 03/13/2016 12/17/2015 08/28/2015  Post procedure Call Back phone  # [phone] [phone]  Permission to leave phone message Yes Yes Yes Yes  Some recent data might be hidden     Patient questions:  Do you have a fever, pain , or abdominal swelling? NO Pain Score  0 *  Have you tolerated food without any problems? Yes.    Have you been able to return to your normal activities? Yes.    Do you have any questions about your discharge instructions: Diet   Yes.   Medications  No. Follow up visit  No.  Do you have questions or concerns about your Care? Yes.    Actions: * If pain score is 4 or above: No action needed, pain <4.

## 2017-07-03 ENCOUNTER — Encounter: Payer: PPO | Admitting: Internal Medicine

## 2017-07-24 DIAGNOSIS — R1314 Dysphagia, pharyngoesophageal phase: Secondary | ICD-10-CM | POA: Diagnosis not present

## 2017-08-25 DIAGNOSIS — R309 Painful micturition, unspecified: Secondary | ICD-10-CM | POA: Diagnosis not present

## 2017-08-25 DIAGNOSIS — R319 Hematuria, unspecified: Secondary | ICD-10-CM | POA: Diagnosis not present

## 2017-09-02 DIAGNOSIS — R319 Hematuria, unspecified: Secondary | ICD-10-CM | POA: Diagnosis not present

## 2017-09-02 DIAGNOSIS — Z87442 Personal history of urinary calculi: Secondary | ICD-10-CM | POA: Diagnosis not present

## 2017-09-02 DIAGNOSIS — N200 Calculus of kidney: Secondary | ICD-10-CM | POA: Diagnosis not present

## 2017-09-16 DIAGNOSIS — N200 Calculus of kidney: Secondary | ICD-10-CM | POA: Diagnosis not present

## 2017-09-16 DIAGNOSIS — R319 Hematuria, unspecified: Secondary | ICD-10-CM | POA: Diagnosis not present

## 2017-09-16 DIAGNOSIS — N1339 Other hydronephrosis: Secondary | ICD-10-CM | POA: Diagnosis not present

## 2017-09-16 DIAGNOSIS — Z87442 Personal history of urinary calculi: Secondary | ICD-10-CM | POA: Diagnosis not present

## 2017-09-16 DIAGNOSIS — N1330 Unspecified hydronephrosis: Secondary | ICD-10-CM | POA: Diagnosis not present

## 2017-09-18 DIAGNOSIS — H25811 Combined forms of age-related cataract, right eye: Secondary | ICD-10-CM | POA: Diagnosis not present

## 2017-09-18 DIAGNOSIS — H25812 Combined forms of age-related cataract, left eye: Secondary | ICD-10-CM | POA: Diagnosis not present

## 2017-09-18 DIAGNOSIS — H52222 Regular astigmatism, left eye: Secondary | ICD-10-CM | POA: Diagnosis not present

## 2017-09-18 DIAGNOSIS — Z9889 Other specified postprocedural states: Secondary | ICD-10-CM | POA: Diagnosis not present

## 2017-09-23 DIAGNOSIS — N200 Calculus of kidney: Secondary | ICD-10-CM | POA: Diagnosis not present

## 2017-10-23 ENCOUNTER — Telehealth: Payer: Self-pay | Admitting: Internal Medicine

## 2017-10-23 NOTE — Telephone Encounter (Signed)
Patient states she is now having trouble with her IBS and pt wants to know what to do or if she needs an ov.

## 2017-10-23 NOTE — Telephone Encounter (Signed)
Patient reports abdominal pain and IBS.  She will come in and see Alonza Bogus, PA at 3:00 on 10/29/17

## 2017-10-29 ENCOUNTER — Encounter (INDEPENDENT_AMBULATORY_CARE_PROVIDER_SITE_OTHER): Payer: Self-pay

## 2017-10-29 ENCOUNTER — Encounter: Payer: Self-pay | Admitting: Gastroenterology

## 2017-10-29 ENCOUNTER — Ambulatory Visit (INDEPENDENT_AMBULATORY_CARE_PROVIDER_SITE_OTHER): Payer: PPO | Admitting: Gastroenterology

## 2017-10-29 VITALS — BP 104/50 | HR 68 | Ht 61.5 in | Wt 122.2 lb

## 2017-10-29 DIAGNOSIS — K5900 Constipation, unspecified: Secondary | ICD-10-CM | POA: Diagnosis not present

## 2017-10-29 DIAGNOSIS — R194 Change in bowel habit: Secondary | ICD-10-CM

## 2017-10-29 DIAGNOSIS — R140 Abdominal distension (gaseous): Secondary | ICD-10-CM | POA: Diagnosis not present

## 2017-10-29 NOTE — Patient Instructions (Signed)
You have been scheduled for a colonoscopy. Please follow written instructions given to you at your visit today.  Please pick up your prep supplies at the pharmacy within the next 1-3 days. If you use inhalers (even only as needed), please bring them with you on the day of your procedure. Your physician has requested that you go to www.startemmi.com and enter the access code given to you at your visit today. This web site gives a general overview about your procedure. However, you should still follow specific instructions given to you by our office regarding your preparation for the procedure.  VSL #3 packet once daily.   Miralax once daily.   You may have a light breakfast the morning of prep day (the day before the procedure).   You may choose from one of the following items: eggs and toast OR chicken noodle soup and crackers.   You should have your breakfast completed between 8:00 and 9:00 am the day before your procedure.    After you have had your light breakfast you should start a clear liquid diet only, NO SOLIDS. No additional solid food is allowed. You may continue to have clear liquid up to 3 hours prior to your procedure.

## 2017-10-29 NOTE — Progress Notes (Addendum)
1/74/0814 Anita [name] [id number] Oct 08, 1941   HISTORY OF PRESENT ILLNESS: This is a pleasant 77 year old female who is known to Dr. Carlean Purl for swallowing issues.  She presents to our office today with bowel complaints.  She says that she had to have some dental work performed and they gave her clindamycin.  That made her feel terrible and gave her diarrhea, and since that time her gut has been messed up.  She no longer has diarrhea, but actually has more constipation.  She feels like there is something blocking her from moving her bowels a lot of times.  He has been taking milk of magnesia every night and that does help her move her bowels better, but then tends to induce some loose stool/diarrhea.  Complaints of a lot of bloating even though she has been eating very little.  Complains of diffuse lower abdominal pain.  She has not had a colonoscopy since 2004.  That was performed by Dr. Gilford Rile in [facility] and she was found to have diverticulosis and internal hemorrhoids at that time.  Just of note, she says that she had been told in the past that she had a very small/narrow rectum and that they needed to use a pediatric scope for her.  Past Medical History:  Diagnosis Date  . Allergy    seasonal  . Cough   . Diverticulitis   . Esophageal candidiasis ([place]) - treated 03/13/2016  . Esophageal stricture   . GERD (gastroesophageal reflux disease)   . IBS (irritable bowel syndrome)   . Kidney stones    right kidney  . Thyroid disease    Past Surgical History:  Procedure Laterality Date  . COLONOSCOPY    . ESOPHAGEAL DILATION    . FACIAL RECONSTRUCTION SURGERY    . HYSTEROTOMY    . lipoma right shoulder removal    . THYROIDECTOMY, PARTIAL    . TUBAL LIGATION    . UPPER GASTROINTESTINAL ENDOSCOPY      reports that she quit smoking about 42 years ago. Her smoking use included cigarettes. She has a 15.00 pack-year smoking history. she has never used smokeless tobacco. She reports  that she drinks about 1.2 oz of alcohol per week. She reports that she does not use drugs. family history includes Breast cancer in her maternal aunt, maternal grandmother, and sister; Diabetes in her maternal aunt, maternal grandmother, and mother; Heart disease in her mother; Kidney disease in her father; Liver cancer in her maternal aunt and paternal uncle. Allergies  Allergen Reactions  . Azithromycin Swelling    Airway swelling, difficult breathing, stomach swelling  . Codeine Anaphylaxis  . Minocin [Minocycline] Anaphylaxis  . Peanut-Containing Drug Products Anaphylaxis  . Penicillins Anaphylaxis    Has patient had a PCN reaction causing immediate rash, facial/tongue/throat swelling, SOB or lightheadedness with hypotension: YES Has patient had a PCN reaction causing severe rash involving mucus membranes or skin nec  Has patient had a PCN reaction that required hospitalization NO Has patient had a PCN reaction occurring within the last 10 years: NO If all of the above answers are "NO", then may proceed with Cephalosporin use.  . Sulfa Antibiotics Anaphylaxis  . Sulfacetamide Sodium Anaphylaxis  . Iohexol Other (See Comments)     Desc: ALLERGIC TO IV CONTRAST-- RESPIRATORY ARREST   . Latex Swelling  . Pseudoephedrine-Naproxen Na Er Other (See Comments)    Quit breathing      Outpatient Encounter Medications as of 10/29/2017  Medication Sig  .  aspirin EC 325 MG tablet Take 325 mg by mouth as needed (pain/headache).   . estradiol (ESTRACE) 0.1 MG/GM vaginal cream Place 0.5 g vaginally daily as needed (vaginal dryness/irritation).   Marland Kitchen levalbuterol (XOPENEX HFA) 45 MCG/ACT inhaler Inhale 1-2 puffs into the lungs every 4 (four) hours as needed for wheezing.  Marland Kitchen levothyroxine (SYNTHROID, LEVOTHROID) 88 MCG tablet Take 88 mcg by mouth See admin instructions. Take 1 tablet (88 mcg) by mouth with applesauce every morning  . omeprazole (PRILOSEC) 40 MG capsule Take 40 mg by mouth See admin  instructions. Open capsule and sprinkle over applesauce once daily  . prednisoLONE (PRELONE) 15 MG/5ML syrup TAKE 10 ML BY MOUTH TWICE DAILY X 5 DAYS.   No facility-administered encounter medications on file as of 10/29/2017.      REVIEW OF SYSTEMS  : All other systems reviewed and negative except where noted in the History of Present Illness.   PHYSICAL EXAM: BP (!) 104/50 (BP Location: Left Arm, Patient Position: Sitting, Cuff Size: Normal)   Pulse 68   Ht 5' 1.5" (1.562 m)   Wt 122 lb 4 oz (55.5 kg)   BMI 22.72 kg/m  General: Well developed white female in no acute distress Head: Normocephalic and atraumatic Eyes:  Sclerae anicteric, conjunctiva pink. Ears: Normal auditory acuity Lungs: Clear throughout to auscultation; no increased WOB. Heart: Regular rate and rhythm; no M/R/G. Abdomen: Soft, non-distended.  BS present.  Mild diffuse TTP. Rectal:  Will be done at the time of colonoscopy. Musculoskeletal: Symmetrical with no gross deformities  Skin: No lesions on visible extremities Extremities: No edema  Neurological: Alert oriented x 4, grossly non-focal Psychological:  Alert and cooperative. Normal mood and affect  ASSESSMENT AND PLAN: *Change in bowel habits with constipation and bloating:  I think that she likely disrupted her gut flora with taking the clindamycin.  I have asked her to begin taking MiraLAX daily to help her move her bowels better and we have given her some samples of VS L #3 powder probiotic to begin taking since she cannot take pills or capsules.  She can then purchase this over-the-counter.  She has not had a colonoscopy since 2004 so she does need a colonoscopy as well.  We will schedule this with Dr. Carlean Purl.  **The risks, benefits, and alternatives to colonoscopy were discussed with the patient and she consents to proceed.   **Of note, the patient reports that they've had to use a pediatric scope for her in the past.  CC:  Anita Shan,  MD   Agree with Anita [name] management.  Gatha Mayer, MD, [facility]

## 2017-11-05 DIAGNOSIS — H25812 Combined forms of age-related cataract, left eye: Secondary | ICD-10-CM | POA: Diagnosis not present

## 2017-11-05 DIAGNOSIS — H2512 Age-related nuclear cataract, left eye: Secondary | ICD-10-CM | POA: Diagnosis not present

## 2017-11-13 ENCOUNTER — Encounter: Payer: Self-pay | Admitting: Internal Medicine

## 2017-11-23 DIAGNOSIS — B9789 Other viral agents as the cause of diseases classified elsewhere: Secondary | ICD-10-CM | POA: Diagnosis not present

## 2017-11-23 DIAGNOSIS — J069 Acute upper respiratory infection, unspecified: Secondary | ICD-10-CM | POA: Diagnosis not present

## 2017-11-23 DIAGNOSIS — J028 Acute pharyngitis due to other specified organisms: Secondary | ICD-10-CM | POA: Diagnosis not present

## 2017-11-23 DIAGNOSIS — J019 Acute sinusitis, unspecified: Secondary | ICD-10-CM | POA: Diagnosis not present

## 2017-11-25 ENCOUNTER — Telehealth: Payer: Self-pay

## 2017-11-25 NOTE — Telephone Encounter (Signed)
Left message on machine to call back  

## 2017-11-25 NOTE — Telephone Encounter (Signed)
Patient called to r/s her procedure for Friday 11-28-17. She states she has a respiratory infection and doesn't think she will be able to handle the prep. Would you like to charge for late cancellation?

## 2017-11-25 NOTE — Telephone Encounter (Signed)
No charge. 

## 2017-11-26 NOTE — Telephone Encounter (Signed)
Left message on machine to call back  

## 2017-11-27 ENCOUNTER — Encounter: Payer: PPO | Admitting: Internal Medicine

## 2017-11-27 NOTE — Telephone Encounter (Signed)
The pt has been advised to take her pills with apple sauce and follow her prep solution as directed.  She will call with any concerns.

## 2018-01-01 ENCOUNTER — Ambulatory Visit (AMBULATORY_SURGERY_CENTER): Payer: PPO | Admitting: Internal Medicine

## 2018-01-01 ENCOUNTER — Encounter: Payer: Self-pay | Admitting: Internal Medicine

## 2018-01-01 VITALS — BP 118/68 | HR 72 | Temp 98.0°F | Resp 21 | Ht 61.5 in | Wt 122.0 lb

## 2018-01-01 DIAGNOSIS — J439 Emphysema, unspecified: Secondary | ICD-10-CM | POA: Diagnosis not present

## 2018-01-01 DIAGNOSIS — E039 Hypothyroidism, unspecified: Secondary | ICD-10-CM | POA: Diagnosis not present

## 2018-01-01 DIAGNOSIS — K5900 Constipation, unspecified: Secondary | ICD-10-CM | POA: Diagnosis not present

## 2018-01-01 DIAGNOSIS — D124 Benign neoplasm of descending colon: Secondary | ICD-10-CM | POA: Diagnosis not present

## 2018-01-01 DIAGNOSIS — R194 Change in bowel habit: Secondary | ICD-10-CM | POA: Diagnosis not present

## 2018-01-01 MED ORDER — SODIUM CHLORIDE 0.9 % IV SOLN: 500.0000 mL | Freq: Once | INTRAVENOUS | Status: DC

## 2018-01-01 NOTE — Progress Notes (Signed)
Called to room to assist during endoscopic procedure.  Patient ID and intended procedure confirmed with present staff. Received instructions for my participation in the procedure from the performing physician.  

## 2018-01-01 NOTE — Progress Notes (Signed)
To recovery, report to RN, VSS. 

## 2018-01-01 NOTE — Patient Instructions (Addendum)
I found and removed one polyp medium sized. You also have a condition called diverticulosis - common and not usually a problem. Please read the handout provided.  The polyp looks benign. I will have it examined and let you know if you should have a repeat colonoscopy.  Please try taking an over the counter treatment called IB Gard for the bloating symptoms. Take 2 of those with meals.  I appreciate the opportunity to care for you. Gatha Mayer, MD, FACG  YOU HAD AN ENDOSCOPIC PROCEDURE TODAY AT [facility]:   Refer to the procedure report that was given to you for any specific questions about what was found during the examination.  If the procedure report does not answer your questions, please call your gastroenterologist to clarify.  If you requested that your care partner not be given the details of your procedure findings, then the procedure report has been included in a sealed envelope for you to review at your convenience later.  YOU SHOULD EXPECT: Some feelings of bloating in the abdomen. Passage of more gas than usual.  Walking can help get rid of the air that was put into your GI tract during the procedure and reduce the bloating. If you had a lower endoscopy (such as a colonoscopy or flexible sigmoidoscopy) you may notice spotting of blood in your stool or on the toilet paper. If you underwent a bowel prep for your procedure, you may not have a normal bowel movement for a few days.  Please Note:  You might notice some irritation and congestion in your nose or some drainage.  This is from the oxygen used during your procedure.  There is no need for concern and it should clear up in a day or so.  SYMPTOMS TO REPORT IMMEDIATELY:   Following lower endoscopy (colonoscopy or flexible sigmoidoscopy):  Excessive amounts of blood in the stool  Significant tenderness or worsening of abdominal pains  Swelling of the abdomen that is new, acute  Fever of 100F or  higher   Following upper endoscopy (EGD)  Vomiting of blood or coffee ground material  New chest pain or pain under the shoulder blades  Painful or persistently difficult swallowing  New shortness of breath  Fever of 100F or higher  Black, tarry-looking stools  For urgent or emergent issues, a gastroenterologist can be reached at any hour by calling [phone].   DIET:  We do recommend a small meal at first, but then you may proceed to your regular diet.  Drink plenty of fluids but you should avoid alcoholic beverages for 24 hours.  ACTIVITY:  You should plan to take it easy for the rest of today and you should NOT DRIVE or use heavy machinery until tomorrow (because of the sedation medicines used during the test).    FOLLOW UP:  Our staff will call the number listed on your records the next business day following your procedure to check on you and address any questions or concerns that you may have regarding the information given to you following your procedure. If we do not reach you, we will leave a message.  However, if you are feeling well and you are not experiencing any problems, there is no need to return our call.  We will assume that you have returned to your regular daily activities without incident.  If any biopsies were taken you will be contacted by phone or by letter within the next 1-3 weeks.  Please call  us at [phone] if you have not heard about the biopsies in 3 weeks.    SIGNATURES/CONFIDENTIALITY: You and/or your care partner have signed paperwork which will be entered into your electronic medical record.  These signatures attest to the fact that that the information above on your After Visit Summary has been reviewed and is understood.  Full responsibility of the confidentiality of this discharge information lies with you and/or your care-partner.  Polyp information and diverticulosis information given.

## 2018-01-01 NOTE — Op Note (Signed)
[place] Patient Name: Anita [name] Procedure Date: 01/01/2018 2:07 PM MRN: [id number] Endoscopist: Gatha Mayer , MD Age: 77 Referring MD:  Date of Birth: 1941-01-03 Gender: Female Account #: [id number] Procedure:                Colonoscopy Indications:              Change in bowel habits Medicines:                Propofol per Anesthesia, Monitored Anesthesia Care Procedure:                Pre-Anesthesia Assessment:                           - Prior to the procedure, a History and Physical                            was performed, and patient medications and                            allergies were reviewed. The patient's tolerance of                            previous anesthesia was also reviewed. The risks                            and benefits of the procedure and the sedation                            options and risks were discussed with the patient.                            All questions were answered, and informed consent                            was obtained. Prior Anticoagulants: The patient has                            taken no previous anticoagulant or antiplatelet                            agents. ASA Grade Assessment: II - A patient with                            mild systemic disease. After reviewing the risks                            and benefits, the patient was deemed in                            satisfactory condition to undergo the procedure.                           After obtaining informed consent, the colonoscope  was passed under direct vision. Throughout the                            procedure, the patient's blood pressure, pulse, and                            oxygen saturations were monitored continuously. The                            Model PCF-H190DL [id number]) scope was introduced                            through the anus and advanced to the the cecum,                            identified by  appendiceal orifice and ileocecal                            valve. The colonoscopy was performed without                            difficulty. The patient tolerated the procedure                            well. The quality of the bowel preparation was                            excellent. The bowel preparation used was Miralax.                            The ileocecal valve, appendiceal orifice, and                            rectum were photographed. Scope In: 2:12:25 PM Scope Out: 2:28:20 PM Scope Withdrawal Time: 0 hours 11 minutes 43 seconds  Total Procedure Duration: 0 hours 15 minutes 55 seconds  Findings:                 The perianal examination was normal.                           The digital rectal exam findings include anal                            stricture.                           A 18 mm polyp was found in the descending colon.                            The polyp was sessile. The polyp was removed with a                            hot snare. Resection and retrieval were complete.  Verification of patient identification for the                            specimen was done. Estimated blood loss: none.                           Multiple diverticula were found in the sigmoid                            colon. There was narrowing of the colon in                            association with the diverticular opening.                           The exam was otherwise without abnormality on                            direct and retroflexion views. Complications:            No immediate complications. Estimated Blood Loss:     Estimated blood loss: none. Impression:               - Anal stricture found on digital rectal exam.                           - One 18 mm polyp in the descending colon, removed                            with a hot snare. Resected and retrieved.                           - Severe diverticulosis in the sigmoid colon. There                             was narrowing of the colon in association with the                            diverticular opening.                           - The examination was otherwise normal on direct                            and retroflexion views. Recommendation:           - Patient has a contact number available for                            emergencies. The signs and symptoms of potential                            delayed complications were discussed with the  patient. Return to normal activities tomorrow.                            Written discharge instructions were provided to the                            patient.                           - Resume previous diet.                           - Continue present medications.                           - No aspirin, ibuprofen, naproxen, or other                            non-steroidal anti-inflammatory drugs for 2 weeks                            after polyp removal.                           - Repeat colonoscopy may be recommended. She is 61                            now. If recommeneded the colonoscopy date will be                            determined after pathology results from today's                            exam become available for review.                           TRY IB GARD FOR BLOATING Gatha Mayer, MD 01/01/2018 2:39:26 PM This report has been signed electronically.

## 2018-01-04 ENCOUNTER — Telehealth: Payer: Self-pay | Admitting: *Deleted

## 2018-01-04 NOTE — Telephone Encounter (Signed)
  Follow up Call-  Call back number 01/01/2018 06/24/2017 03/13/2016 12/17/2015 08/28/2015  Post procedure Call Back phone  # [phone] [phone]  Permission to leave phone message Yes Yes Yes Yes Yes  Some recent data might be hidden     Patient questions:  Do you have a fever, pain , or abdominal swelling? No. Pain Score  0 *  Have you tolerated food without any problems? Yes.    Have you been able to return to your normal activities? Yes.    Do you have any questions about your discharge instructions: Diet   No. Medications  No. Follow up visit  No.  Do you have questions or concerns about your Care? No.  Actions: * If pain score is 4 or above: No action needed, pain <4.

## 2018-01-09 ENCOUNTER — Encounter: Payer: Self-pay | Admitting: Internal Medicine

## 2018-01-09 DIAGNOSIS — Z86010 Personal history of colonic polyps: Secondary | ICD-10-CM

## 2018-01-09 HISTORY — DX: Personal history of colonic polyps: Z86.010

## 2018-01-09 NOTE — Progress Notes (Signed)
TV adenoma 18 mm Consider 3 year repeat colon

## 2018-01-26 ENCOUNTER — Emergency Department (HOSPITAL_COMMUNITY): Payer: PPO

## 2018-01-26 ENCOUNTER — Encounter (HOSPITAL_COMMUNITY): Payer: Self-pay | Admitting: Emergency Medicine

## 2018-01-26 ENCOUNTER — Emergency Department (HOSPITAL_COMMUNITY)
Admission: EM | Admit: 2018-01-26 | Discharge: 2018-01-26 | Disposition: A | Discharge status: Home / Self Care | Payer: PPO | Attending: Emergency Medicine | Admitting: Emergency Medicine

## 2018-01-26 DIAGNOSIS — E079 Disorder of thyroid, unspecified: Secondary | ICD-10-CM | POA: Diagnosis not present

## 2018-01-26 DIAGNOSIS — Z87891 Personal history of nicotine dependence: Secondary | ICD-10-CM | POA: Diagnosis not present

## 2018-01-26 DIAGNOSIS — Z91040 Latex allergy status: Secondary | ICD-10-CM | POA: Insufficient documentation

## 2018-01-26 DIAGNOSIS — Z91010 Allergy to peanuts: Secondary | ICD-10-CM | POA: Diagnosis not present

## 2018-01-26 DIAGNOSIS — R1310 Dysphagia, unspecified: Secondary | ICD-10-CM | POA: Diagnosis not present

## 2018-01-26 DIAGNOSIS — J441 Chronic obstructive pulmonary disease with (acute) exacerbation: Secondary | ICD-10-CM | POA: Diagnosis not present

## 2018-01-26 DIAGNOSIS — Z79899 Other long term (current) drug therapy: Secondary | ICD-10-CM | POA: Insufficient documentation

## 2018-01-26 DIAGNOSIS — R0602 Shortness of breath: Secondary | ICD-10-CM | POA: Diagnosis not present

## 2018-01-26 LAB — I-STAT TROPONIN, ED: TROPONIN I, POC: 0.01 ng/mL (ref 0.00–0.08)

## 2018-01-26 LAB — CBC
HCT: 47.1 % — ABNORMAL HIGH (ref 36.0–46.0)
Hemoglobin: 15.6 g/dL — ABNORMAL HIGH (ref 12.0–15.0)
MCH: 30.2 pg (ref 26.0–34.0)
MCHC: 33.1 g/dL (ref 30.0–36.0)
MCV: 91.3 fL (ref 78.0–100.0)
PLATELETS: 255 10*3/uL (ref 150–400)
RBC: 5.16 MIL/uL — AB (ref 3.87–5.11)
RDW: 12.8 % (ref 11.5–15.5)
WBC: 10.3 10*3/uL (ref 4.0–10.5)

## 2018-01-26 LAB — BASIC METABOLIC PANEL
Anion gap: 12 (ref 5–15)
BUN: 9 mg/dL (ref 6–20)
CALCIUM: 9.1 mg/dL (ref 8.9–10.3)
CO2: 25 mmol/L (ref 22–32)
CREATININE: 0.74 mg/dL (ref 0.44–1.00)
Chloride: 100 mmol/L — ABNORMAL LOW (ref 101–111)
GFR calc non Af Amer: >60 mL/min (ref >60)
Glucose, Bld: 92 mg/dL (ref 65–99)
Potassium: 3.7 mmol/L (ref 3.5–5.1)
SODIUM: 137 mmol/L (ref 135–145)

## 2018-01-26 MED ORDER — METHYLPREDNISOLONE SODIUM SUCC 125 MG IJ SOLR
125.0000 mg | Freq: Once | INTRAMUSCULAR | Status: AC
  Administered 2018-01-26: 125 mg via INTRAVENOUS
  Filled 2018-01-26: qty 2

## 2018-01-26 MED ORDER — ALBUTEROL SULFATE (2.5 MG/3ML) 0.083% IN NEBU
5.0000 mg | INHALATION_SOLUTION | Freq: Once | RESPIRATORY_TRACT | Status: AC
  Administered 2018-01-26: 5 mg via RESPIRATORY_TRACT
  Filled 2018-01-26 (×2): qty 6

## 2018-01-26 MED ORDER — ALBUTEROL (5 MG/ML) CONTINUOUS INHALATION SOLN
10.0000 mg/h | INHALATION_SOLUTION | Freq: Once | RESPIRATORY_TRACT | Status: AC
  Administered 2018-01-26: 10 mg/h via RESPIRATORY_TRACT
  Filled 2018-01-26: qty 20

## 2018-01-26 MED ORDER — PREDNISONE 20 MG PO TABS: 20.0000 mg | ORAL_TABLET | Freq: Two times a day (BID) | ORAL | 0 refills | Status: DC

## 2018-01-26 MED ORDER — ALBUTEROL (5 MG/ML) CONTINUOUS INHALATION SOLN
INHALATION_SOLUTION | RESPIRATORY_TRACT | Status: AC
  Filled 2018-01-26: qty 20

## 2018-01-26 MED ORDER — PREDNISOLONE 15 MG/5ML PO SOLN: 30.0000 mg | Freq: Two times a day (BID) | ORAL | 0 refills | Status: AC

## 2018-01-26 MED ORDER — MAGNESIUM SULFATE 2 GM/50ML IV SOLN
2.0000 g | Freq: Once | INTRAVENOUS | Status: AC
  Administered 2018-01-26: 2 g via INTRAVENOUS
  Filled 2018-01-26: qty 50

## 2018-01-26 NOTE — ED Provider Notes (Signed)
[facility] EMERGENCY DEPARTMENT Provider Note   CSN: [id number] Arrival date & time: 01/26/18  0856     History   Chief Complaint Chief Complaint  Patient presents with  . Shortness of Breath    HPI Anita [name] is a 77 y.o. female.  She complains of difficulty swallowing since Friday, 5 days ago, after eating rice at a [employer].  Since that time she has been able to eat a small amount of solid food which is chopped up very small.  She has mostly been using nutritional supplements and thin liquids, for nutrition since then.  She is worried that she has another esophageal stricture.   HPI  Past Medical History:  Diagnosis Date  . Allergy    seasonal  . Cough   . Diverticulitis   . Esophageal candidiasis ([place]) - treated 03/13/2016  . Esophageal stricture   . GERD (gastroesophageal reflux disease)   . Hx of adenomatous polyp of colon 01/09/2018  . IBS (irritable bowel syndrome)   . Kidney stones    right kidney  . Thyroid disease     Patient Active Problem List   Diagnosis Date Noted  . Hx of adenomatous polyp of colon 01/09/2018  . Bloating 10/29/2017  . Other dysphagia 05/13/2017  . Positive D dimer   . Precordial chest pain   . Chest pain 05/02/2017  . Shortness of breath 05/02/2017  . Cough 05/02/2017  . Emphysema of lung ([place]) 05/02/2017  . Esophageal candidiasis ([place]) - treated 03/13/2016  . Dysphagia, pharyngoesophageal phase 09/20/2015  . Upper airway cough syndrome 09/20/2015    Past Surgical History:  Procedure Laterality Date  . COLONOSCOPY    . ESOPHAGEAL DILATION    . FACIAL RECONSTRUCTION SURGERY    . HYSTEROTOMY    . lipoma right shoulder removal    . THYROIDECTOMY, PARTIAL    . TUBAL LIGATION    . UPPER GASTROINTESTINAL ENDOSCOPY       OB History   None      Home Medications    Prior to Admission medications   Medication Sig Start Date End Date Taking? Authorizing Provider  aspirin EC 325 MG tablet  Take 325 mg by mouth as needed (pain/headache).     [provider]  estradiol (ESTRACE) 0.1 MG/GM vaginal cream Place 0.5 g vaginally daily as needed (vaginal dryness/irritation).  03/06/17   [provider]  levalbuterol Penne Lash HFA) 45 MCG/ACT inhaler Inhale 1-2 puffs into the lungs every 4 (four) hours as needed for wheezing. 01/13/17   Lysbeth Penner, FNP  levothyroxine (SYNTHROID, LEVOTHROID) 88 MCG tablet Take 88 mcg by mouth See admin instructions. Take 1 tablet (88 mcg) by mouth with applesauce every morning    [provider]  omeprazole (PRILOSEC) 40 MG capsule Take 40 mg by mouth See admin instructions. Open capsule and sprinkle over applesauce once daily 10/06/16   [provider]  prednisoLONE (PRELONE) 15 MG/5ML syrup TAKE 10 ML BY MOUTH TWICE DAILY X 5 DAYS. 05/06/17   [provider]    Family History Family History  Problem Relation Age of Onset  . Heart disease Mother   . Diabetes Mother   . Kidney disease Father   . Breast cancer Sister   . Breast cancer Maternal Aunt   . Liver cancer Maternal Aunt   . Diabetes Maternal Aunt   . Liver cancer Paternal Uncle   . Breast cancer Maternal Grandmother   . Diabetes Maternal Grandmother   .  Colon cancer Neg Hx   . Stomach cancer Neg Hx   . Esophageal cancer Neg Hx   . Pancreatic cancer Neg Hx   . Rectal cancer Neg Hx     Social History Social History   Tobacco Use  . Smoking status: Former Smoker    Packs/day: 1.00    Years: 15.00    Pack years: 15.00    Types: Cigarettes    Last attempt to quit: 10/21/1975    Years since quitting: 42.2  . Smokeless tobacco: Never Used  Substance Use Topics  . Alcohol use: Yes    Alcohol/week: 1.2 oz    Types: 2 Glasses of wine per week    Comment: 1-2 glasses of wine a week  . Drug use: No     Allergies   Azithromycin; Codeine; Minocin [minocycline]; Peanut-containing drug products; Penicillins; Sulfa antibiotics; Sulfacetamide  sodium; Iohexol; Latex; and Pseudoephedrine-naproxen na er   Review of Systems Review of Systems   Physical Exam Updated Vital Signs BP 122/62   Pulse 90   Temp 98.2 F (36.8 C) (Oral)   Resp 17   SpO2 92%   Physical Exam   ED Treatments / Results  Labs (all labs ordered are listed, but only abnormal results are displayed) Labs Reviewed  BASIC METABOLIC PANEL - Abnormal; Notable for the following components:      Result Value   Chloride 100 (*)    All other components within normal limits  CBC - Abnormal; Notable for the following components:   RBC 5.16 (*)    Hemoglobin 15.6 (*)    HCT 47.1 (*)    All other components within normal limits  I-STAT TROPONIN, ED    EKG None  Radiology Dg Chest 2 View  Result Date: 01/26/2018 CLINICAL DATA:  Shortness of breath, chest tightness. History of dysphagia, emphysema. EXAM: CHEST - 2 VIEW COMPARISON:  Chest x-ray and chest CT scan of May 02, 2017 FINDINGS: The lungs are hyperinflated but clear. The heart and pulmonary vascularity are normal. There is calcification in the wall of the thoracic aorta. There is no pleural effusion. The bony thorax is unremarkable. IMPRESSION: COPD. No pneumonia, CHF, nor other acute cardiopulmonary abnormality. Thoracic aortic atherosclerosis. Electronically Signed   By: David  Martinique M.D.   On: 01/26/2018 09:45    Procedures .Critical Care Performed by: Daleen Bo, MD Authorized by: Daleen Bo, MD   Critical care provider statement:    Critical care time (minutes):  35   Critical care start time:  01/26/2018 12:06 PM   Critical care end time:  01/26/2018 5:29 PM   Critical care time was exclusive of:  Separately billable procedures and treating other patients   Critical care was necessary to treat or prevent imminent or life-threatening deterioration of the following conditions:  Respiratory failure   Critical care was time spent personally by me on the following activities:  Blood draw  for specimens, development of treatment plan with patient or surrogate, discussions with consultants, evaluation of patient's response to treatment, examination of patient, obtaining history from patient or surrogate, ordering and performing treatments and interventions, ordering and review of laboratory studies, pulse oximetry, re-evaluation of patient's condition, review of old charts and ordering and review of radiographic studies   (including critical care time)  Medications Ordered in ED Medications  albuterol (PROVENTIL) (2.5 MG/3ML) 0.083% nebulizer solution 5 mg (5 mg Nebulization Given 01/26/18 0914)     Initial Impression / Assessment and Plan / ED Course  I have reviewed the triage vital signs and the nursing notes.  Pertinent labs & imaging results that were available during my care of the patient were reviewed by me and considered in my medical decision making (see chart for details).  Clinical Course as of Jan 27 1811  Tue Jan 26, 2018  1302 She began to feel short of breath again, about 20 minutes ago this is persisted, despite treatment with oxygen.  Currently on oxygen her saturation is 91%, low.  She is coughing and producing a small amount of clear sputum.  She is in mild respiratory distress with increased work of breathing, and decreased air movement bilaterally with scattered rhonchi.  Ordered second nebulizer treatment, and magnesium.  Reviewed data from earlier, chest x-ray does not show pneumonia or congestive heart failure.  Will repeat chest x-ray with portable study.   [EW]  4742 Following continuous nebulizer she is much more comfortable, now lying semirecumbent without respiratory distress.  Will order dose of Solu-Medrol.   [EW]  5956 Case discussed with her GI doctor, he does not believe her presenting complaints are consistent with esophageal obstruction therefore he recommends outpatient follow-up.   [EW]  1811 At this time room air oxygen saturation, 90%.   Patient is not in respiratory distress.  She is comfortable at this time and wishes to go home.   [EW]    Clinical Course User Index [EW] Daleen Bo, MD     Patient Vitals for the past 24 hrs:  BP Temp Temp src Pulse Resp SpO2  01/26/18 1700 (!) 105/48 - - (!) 101 (!) 26 92 %  01/26/18 1630 (!) 102/49 - - 99 (!) 30 91 %  01/26/18 1600 (!) 103/51 - - (!) 103 (!) 30 92 %  01/26/18 1530 (!) 101/50 - - (!) 105 (!) 32 95 %  01/26/18 1500 (!) 108/48 - - (!) 113 19 97 %  01/26/18 1430 (!) 106/53 - - (!) 111 (!) 22 92 %  01/26/18 1345 (!) 116/53 - - (!) 132 (!) 35 100 %  01/26/18 1315 122/63 - - 95 (!) 24 100 %  01/26/18 1300 111/76 - - (!) 113 (!) 30 92 %  01/26/18 1245 (!) 121/53 - - 97 (!) 34 94 %  01/26/18 1215 125/61 - - 93 (!) 23 94 %  01/26/18 1203 122/62 - - 97 19 94 %  01/26/18 1150 122/62 98.2 F (36.8 C) Oral 90 17 92 %  01/26/18 0905 139/69 99 F (37.2 C) Oral (!) 103 20 90 %    Medical decision making-evaluation consistent with bronchitis, with COPD exacerbation.  Patient improved symptomatic treatment.  She was recently treated with I do not think she will benefit from another course of antibiotics at this time.  Doubt esophageal obstruction.  Doubt pneumonia.  Doubt metabolic instability.  Borderline low oxygen but no requirement for oxygen treatment at this time.  Nursing Notes Reviewed/ Care Coordinated Applicable Imaging Reviewed Interpretation of Laboratory Data incorporated into ED treatment  The patient appears reasonably screened and/or stabilized for discharge and I doubt any other medical condition or other [facility] requiring further screening, evaluation, or treatment in the ED at this time prior to discharge.  Plan: Home Medications-continue usual medications; Home Treatments-rest, fluids, gradually advance diet; return here if the recommended treatment, does not improve the symptoms; Recommended follow up- pcp 1 week     Final Clinical Impressions(s) / ED  Diagnoses   Final diagnoses:  COPD exacerbation ([place])  ED Discharge Orders    None       Daleen Bo, MD 01/26/18 [phone]

## 2018-01-26 NOTE — ED Notes (Signed)
Pt called out c/o difficulty breathing, placed on 2L O2 Dr. Eulis Foster notified.

## 2018-01-26 NOTE — Discharge Instructions (Signed)
Start the prednisone prescription tomorrow morning.  Use your albuterol inhaler 2 puffs every 3-4 hours as needed for cough or trouble breathing.  Follow-up with your primary care doctor for a checkup next week.

## 2018-01-26 NOTE — ED Notes (Signed)
Pt called out for difficulty breathing, Dr. Eulis Foster at bedside

## 2018-01-26 NOTE — ED Notes (Signed)
Respiratory called to bedside for continuous neb

## 2018-01-26 NOTE — ED Triage Notes (Signed)
Pt states she ate at [employer] on Friday and is concerned she may have some food in her lungs. She has dysphagia and acid reflux. Pt denies choking/coughing in the restaurant.  Pt has been feeling SOB since yesterday and states this has happened before. She has chest tightness because she states she has "food stuck in there."

## 2018-01-26 NOTE — ED Notes (Signed)
Pt stable, ambulatory, states understanding of discharge instructions 

## 2018-01-26 NOTE — ED Notes (Signed)
Dr. Eulis Foster made aware of patients discharge vitals before leaving

## 2018-02-11 DIAGNOSIS — K219 Gastro-esophageal reflux disease without esophagitis: Secondary | ICD-10-CM | POA: Diagnosis not present

## 2018-02-11 DIAGNOSIS — R1314 Dysphagia, pharyngoesophageal phase: Secondary | ICD-10-CM | POA: Diagnosis not present

## 2018-02-11 DIAGNOSIS — E039 Hypothyroidism, unspecified: Secondary | ICD-10-CM | POA: Diagnosis not present

## 2018-02-11 DIAGNOSIS — F458 Other somatoform disorders: Secondary | ICD-10-CM | POA: Diagnosis not present

## 2018-06-02 DIAGNOSIS — Z1231 Encounter for screening mammogram for malignant neoplasm of breast: Secondary | ICD-10-CM | POA: Diagnosis not present

## 2018-06-02 DIAGNOSIS — Z803 Family history of malignant neoplasm of breast: Secondary | ICD-10-CM | POA: Diagnosis not present

## 2018-07-15 DIAGNOSIS — R300 Dysuria: Secondary | ICD-10-CM | POA: Diagnosis not present

## 2018-07-15 DIAGNOSIS — R350 Frequency of micturition: Secondary | ICD-10-CM | POA: Diagnosis not present

## 2018-07-15 DIAGNOSIS — R399 Unspecified symptoms and signs involving the genitourinary system: Secondary | ICD-10-CM | POA: Diagnosis not present

## 2018-08-17 IMAGING — NM NM PULMONARY VENT & PERF
16 series · 16 of 16 positions shown · non-contrast
Comparison: None

Radiographic correlation: Chest radiograph 05/02/2017, CT chest
05/02/2017

CLINICAL DATA: Cough, former smoker, COPD/ emphysema, 1 month
history of chest pain and shortness of breath, chest pressure,
bringing up brown sputum, elevated D-dimer, former smoker

EXAM:
NUCLEAR MEDICINE VENTILATION - PERFUSION LUNG SCAN
TECHNIQUE: Ventilation images were obtained in multiple projections using
inhaled aerosol Pc-FFm DTPA. Perfusion images were obtained in
multiple projections after intravenous injection of Pc-FFm MAA.
RADIOPHARMACEUTICALS:  31.7 mCi 7echnetium-00m DTPA aerosol
inhalation and 4.1 mCi 7echnetium-00m MAA IV

[Series 1: ant/post vent · 4.14mm/px · 1 of 1 slices shown (1 of 2)]
[im 1/1]
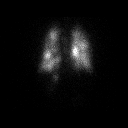

[Series 1: ant/post vent · 4.14mm/px · 1 of 1 slices shown (2 of 2)]
[im 1/1]
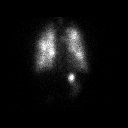

[Series 2: lao/rpo vent · 4.14mm/px · 1 of 1 slices shown (1 of 2)]
[im 1/1]
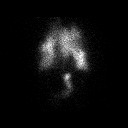

[Series 2: lao/rpo vent · 4.14mm/px · 1 of 1 slices shown (2 of 2)]
[im 1/1]
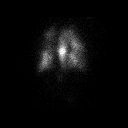

[Series 3: lpo/rao vent · 4.14mm/px · 1 of 1 slices shown (1 of 2)]
[im 1/1]
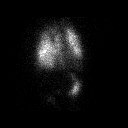

[Series 3: lpo/rao vent · 4.14mm/px · 1 of 1 slices shown (2 of 2)]
[im 1/1]
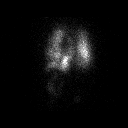

[Series 4: lt lat/rt lat vent · 4.14mm/px · 1 of 1 slices shown (1 of 2)]
[im 1/1]
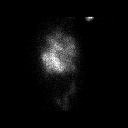

[Series 4: lt lat/rt lat vent · 4.14mm/px · 1 of 1 slices shown (2 of 2)]
[im 1/1]
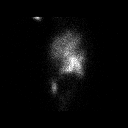

[Series 5: lt lat/rt lat perf · 4.14mm/px · 1 of 1 slices shown (1 of 2)]
[im 1/1]
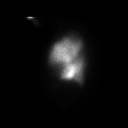

[Series 5: lt lat/rt lat perf · 4.14mm/px · 1 of 1 slices shown (2 of 2)]
[im 1/1]
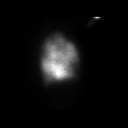

[Series 6: lpo/rao perf · 4.14mm/px · 1 of 1 slices shown (1 of 2)]
[im 1/1]
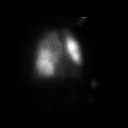

[Series 6: lpo/rao perf · 4.14mm/px · 1 of 1 slices shown (2 of 2)]
[im 1/1]
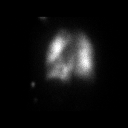

[Series 7: ant/post perf · 4.14mm/px · 1 of 1 slices shown (1 of 2)]
[im 1/1]
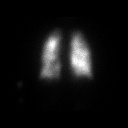

[Series 7: ant/post perf · 4.14mm/px · 1 of 1 slices shown (2 of 2)]
[im 1/1]
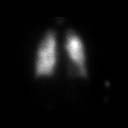

[Series 8: lao/rpo perf · 4.14mm/px · 1 of 1 slices shown (1 of 2)]
[im 1/1]
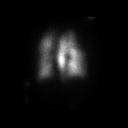

[Series 8: lao/rpo perf · 4.14mm/px · 1 of 1 slices shown (2 of 2)]
[im 1/1]
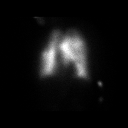

[16 of 16 positions shown; findings below may reference images not displayed]

FINDINGS: Ventilation: Patchy ventilation in the periphery of both lungs
including subsegmental perfusion defects in BILATERAL upper lobes,
RIGHT middle lobe, both lower lobes.

Perfusion: Subsegmental perfusion defect lateral RIGHT upper lobe
matching ventilation finding. Matching large subsegmental perfusion
defect in LEFT lower lobe. Better perfusion than ventilation in LEFT
upper lobe.

Chest radiograph:  Emphysematous changes without infiltrate
IMPRESSION: Low probability for pulmonary embolism.

## 2018-09-10 DIAGNOSIS — R1032 Left lower quadrant pain: Secondary | ICD-10-CM | POA: Diagnosis not present

## 2018-09-10 DIAGNOSIS — N9411 Superficial (introital) dyspareunia: Secondary | ICD-10-CM | POA: Diagnosis not present

## 2018-09-10 DIAGNOSIS — N905 Atrophy of vulva: Secondary | ICD-10-CM | POA: Diagnosis not present

## 2018-09-10 DIAGNOSIS — R1031 Right lower quadrant pain: Secondary | ICD-10-CM | POA: Diagnosis not present

## 2018-09-10 DIAGNOSIS — Z79890 Hormone replacement therapy (postmenopausal): Secondary | ICD-10-CM | POA: Diagnosis not present

## 2018-09-10 DIAGNOSIS — M545 Low back pain: Secondary | ICD-10-CM | POA: Diagnosis not present

## 2018-09-10 DIAGNOSIS — N949 Unspecified condition associated with female genital organs and menstrual cycle: Secondary | ICD-10-CM | POA: Diagnosis not present

## 2018-09-10 DIAGNOSIS — R1030 Lower abdominal pain, unspecified: Secondary | ICD-10-CM | POA: Diagnosis not present

## 2018-09-10 DIAGNOSIS — N952 Postmenopausal atrophic vaginitis: Secondary | ICD-10-CM | POA: Diagnosis not present

## 2018-09-15 ENCOUNTER — Encounter (HOSPITAL_BASED_OUTPATIENT_CLINIC_OR_DEPARTMENT_OTHER): Payer: Self-pay | Admitting: Emergency Medicine

## 2018-09-15 ENCOUNTER — Emergency Department (HOSPITAL_BASED_OUTPATIENT_CLINIC_OR_DEPARTMENT_OTHER)
Admission: EM | Admit: 2018-09-15 | Discharge: 2018-09-15 | Disposition: A | Discharge status: Home / Self Care | Payer: PPO | Attending: Emergency Medicine | Admitting: Emergency Medicine

## 2018-09-15 ENCOUNTER — Emergency Department (HOSPITAL_BASED_OUTPATIENT_CLINIC_OR_DEPARTMENT_OTHER): Payer: PPO

## 2018-09-15 ENCOUNTER — Other Ambulatory Visit: Payer: Self-pay

## 2018-09-15 DIAGNOSIS — J069 Acute upper respiratory infection, unspecified: Secondary | ICD-10-CM | POA: Diagnosis not present

## 2018-09-15 DIAGNOSIS — Z87891 Personal history of nicotine dependence: Secondary | ICD-10-CM | POA: Diagnosis not present

## 2018-09-15 DIAGNOSIS — B9789 Other viral agents as the cause of diseases classified elsewhere: Secondary | ICD-10-CM | POA: Diagnosis not present

## 2018-09-15 DIAGNOSIS — R05 Cough: Secondary | ICD-10-CM | POA: Diagnosis not present

## 2018-09-15 DIAGNOSIS — Z79899 Other long term (current) drug therapy: Secondary | ICD-10-CM | POA: Insufficient documentation

## 2018-09-15 DIAGNOSIS — Z91040 Latex allergy status: Secondary | ICD-10-CM | POA: Insufficient documentation

## 2018-09-15 DIAGNOSIS — J029 Acute pharyngitis, unspecified: Secondary | ICD-10-CM | POA: Diagnosis present

## 2018-09-15 DIAGNOSIS — Z91010 Allergy to peanuts: Secondary | ICD-10-CM | POA: Diagnosis not present

## 2018-09-15 DIAGNOSIS — R0602 Shortness of breath: Secondary | ICD-10-CM | POA: Diagnosis not present

## 2018-09-15 NOTE — ED Triage Notes (Signed)
Pt having flu like symptoms since this morning with sore throat and chest congestion, pt AO x 4 NAD noticed.

## 2018-09-15 NOTE — ED Provider Notes (Signed)
[facility] EMERGENCY DEPARTMENT Provider Note   CSN: [id number] Arrival date & time: 09/15/18  2951     History   Chief Complaint Chief Complaint  Patient presents with  . Influenza    HPI Anita [name] is a 77 y.o. female.  77 year old female with past medical history including esophageal stricture, esophageal candidiasis, diverticulitis, GERD, IBS, kidney stones who presents with sore throat and cough.  This morning she woke up around 5:30 AM with a sore throat, raspy voice, and cough productive of mucus.  She states that her cough has persisted throughout the day.  He has used Flonase and albuterol.  She denies any shortness of breath.  No associated fevers, vomiting, diarrhea, urinary symptoms, sick contacts, or recent travel.  The history is provided by the patient.    Past Medical History:  Diagnosis Date  . Allergy    seasonal  . Cough   . Diverticulitis   . Esophageal candidiasis ([place]) - treated 03/13/2016  . Esophageal stricture   . GERD (gastroesophageal reflux disease)   . Hx of adenomatous polyp of colon 01/09/2018  . IBS (irritable bowel syndrome)   . Kidney stones    right kidney  . Thyroid disease     Patient Active Problem List   Diagnosis Date Noted  . Hx of adenomatous polyp of colon 01/09/2018  . Bloating 10/29/2017  . Other dysphagia 05/13/2017  . Positive D dimer   . Precordial chest pain   . Chest pain 05/02/2017  . Shortness of breath 05/02/2017  . Cough 05/02/2017  . Emphysema of lung ([place]) 05/02/2017  . Esophageal candidiasis ([place]) - treated 03/13/2016  . Dysphagia, pharyngoesophageal phase 09/20/2015  . Upper airway cough syndrome 09/20/2015    Past Surgical History:  Procedure Laterality Date  . COLONOSCOPY    . ESOPHAGEAL DILATION    . FACIAL RECONSTRUCTION SURGERY    . HYSTEROTOMY    . lipoma right shoulder removal    . THYROIDECTOMY, PARTIAL    . TUBAL LIGATION    . UPPER GASTROINTESTINAL ENDOSCOPY       OB  History   None      Home Medications    Prior to Admission medications   Medication Sig Start Date End Date Taking? Authorizing Provider  aspirin EC 325 MG tablet Take 325 mg by mouth as needed (pain/headache).    Yes [provider]  fluticasone (FLONASE) 50 MCG/ACT nasal spray Place 1 spray into both nostrils as needed for allergies or rhinitis.   Yes [provider]  levalbuterol (XOPENEX HFA) 45 MCG/ACT inhaler Inhale 1-2 puffs into the lungs every 4 (four) hours as needed for wheezing. 01/13/17  Yes Lysbeth Penner, FNP  levothyroxine (SYNTHROID, LEVOTHROID) 88 MCG tablet Take 88 mcg by mouth See admin instructions. Take 1 tablet (88 mcg) by mouth with applesauce every morning   Yes [provider]  omeprazole (PRILOSEC) 40 MG capsule Take 40 mg by mouth See admin instructions. Open capsule and sprinkle over applesauce once daily 10/06/16  Yes [provider]  estradiol (ESTRACE) 0.1 MG/GM vaginal cream Place 0.5 g vaginally daily as needed (vaginal dryness/irritation).  03/06/17   [provider]  Polyvinyl Alcohol-Povidone (REFRESH OP) Apply 1 drop to eye as needed (DRY EYES).    [provider]    Family History Family History  Problem Relation Age of Onset  . Heart disease Mother   . Diabetes Mother   . Kidney disease Father   .  Breast cancer Sister   . Breast cancer Maternal Aunt   . Liver cancer Maternal Aunt   . Diabetes Maternal Aunt   . Liver cancer Paternal Uncle   . Breast cancer Maternal Grandmother   . Diabetes Maternal Grandmother   . Colon cancer Neg Hx   . Stomach cancer Neg Hx   . Esophageal cancer Neg Hx   . Pancreatic cancer Neg Hx   . Rectal cancer Neg Hx     Social History Social History   Tobacco Use  . Smoking status: Former Smoker    Packs/day: 1.00    Years: 15.00    Pack years: 15.00    Types: Cigarettes    Last attempt to quit: 10/21/1975    Years since quitting: 42.9  . Smokeless  tobacco: Never Used  Substance Use Topics  . Alcohol use: Yes    Alcohol/week: 2.0 standard drinks    Types: 2 Glasses of wine per week    Comment: 1-2 glasses of wine a week  . Drug use: No     Allergies   Azithromycin; Clindamycin; Codeine; Minocin [minocycline]; Peanut-containing drug products; Penicillins; Sulfa antibiotics; Sulfacetamide sodium; Iohexol; Latex; and Pseudoephedrine-naproxen na er   Review of Systems Review of Systems All other systems reviewed and are negative except that which was mentioned in HPI  Physical Exam Updated Vital Signs BP (!) 104/91 (BP Location: Right Arm)   Pulse 75   Temp 98.3 F (36.8 C) (Oral)   Resp 19   Ht 5\' 2"  (1.575 m)   Wt 53.5 kg   SpO2 96%   BMI 21.58 kg/m   Physical Exam  Constitutional: She is oriented to person, place, and time. She appears well-developed and well-nourished. No distress.  HENT:  Head: Normocephalic and atraumatic.  Mouth/Throat: Oropharynx is clear and moist.  Moist mucous membranes  Eyes: Conjunctivae are normal.  Neck: Neck supple.  Cardiovascular: Normal rate, regular rhythm and normal heart sounds.  No murmur heard. Pulmonary/Chest: Effort normal and breath sounds normal.  Occasional cough  Abdominal: Soft. Bowel sounds are normal. She exhibits no distension. There is no tenderness.  Musculoskeletal: She exhibits no edema.  Neurological: She is alert and oriented to person, place, and time.  Fluent speech  Skin: Skin is warm and dry.  Psychiatric: She has a normal mood and affect. Judgment normal.  Nursing note and vitals reviewed.    ED Treatments / Results  Labs (all labs ordered are listed, but only abnormal results are displayed) Labs Reviewed - No data to display  EKG None  Radiology Dg Chest 2 View  Result Date: 09/15/2018 CLINICAL DATA:  Shortness of breath EXAM: CHEST - 2 VIEW COMPARISON:  01/26/2018 FINDINGS: No focal opacity or pleural effusion. Stable cardiomediastinal  silhouette with aortic atherosclerosis. No pneumothorax. Clips at the thoracic inlet. IMPRESSION: No active cardiopulmonary disease. Electronically Signed   By: Donavan Foil M.D.   On: 09/15/2018 20:03    Procedures Procedures (including critical care time)  Medications Ordered in ED Medications - No data to display   Initial Impression / Assessment and Plan / ED Course  I have reviewed the triage vital signs and the nursing notes.  Pertinent labs & imaging results that were available during my care of the patient were reviewed by me and considered in my medical decision making (see chart for details).    After bowling exam, reassuring vital signs.  Normal O2 saturation on room air.  No wheezing or crackles.  Chest  x-ray is normal.  Symptoms are consistent with viral URI.  She has no fevers, body aches, or other symptoms to suggest influenza.  I offered several types of medications for cough suppressant but patient has refused each 1 stating she has an allergy or is unable to swallow them.  Discussed supportive measures including Mucinex and humidifier at night.  Instructed to see PCP in a few days for reassessment.  At this point she does not have any indication for steroids or antibiotic based on clear chest x-ray and no wheezing on exam.  I have reviewed return precautions and she voiced understanding.  Final Clinical Impressions(s) / ED Diagnoses   Final diagnoses:  Viral URI with cough    ED Discharge Orders    None       , Wenda Overland, MD 09/15/18 2040

## 2018-09-20 ENCOUNTER — Encounter (HOSPITAL_BASED_OUTPATIENT_CLINIC_OR_DEPARTMENT_OTHER): Payer: Self-pay | Admitting: *Deleted

## 2018-09-20 ENCOUNTER — Other Ambulatory Visit: Payer: Self-pay

## 2018-09-20 ENCOUNTER — Emergency Department (HOSPITAL_BASED_OUTPATIENT_CLINIC_OR_DEPARTMENT_OTHER)
Admission: EM | Admit: 2018-09-20 | Discharge: 2018-09-20 | Disposition: A | Discharge status: Home / Self Care | Payer: PPO | Attending: Emergency Medicine | Admitting: Emergency Medicine

## 2018-09-20 ENCOUNTER — Emergency Department (HOSPITAL_BASED_OUTPATIENT_CLINIC_OR_DEPARTMENT_OTHER): Payer: PPO

## 2018-09-20 DIAGNOSIS — Z87891 Personal history of nicotine dependence: Secondary | ICD-10-CM | POA: Insufficient documentation

## 2018-09-20 DIAGNOSIS — Z91040 Latex allergy status: Secondary | ICD-10-CM | POA: Insufficient documentation

## 2018-09-20 DIAGNOSIS — J029 Acute pharyngitis, unspecified: Secondary | ICD-10-CM

## 2018-09-20 DIAGNOSIS — R059 Cough, unspecified: Secondary | ICD-10-CM

## 2018-09-20 DIAGNOSIS — Z91010 Allergy to peanuts: Secondary | ICD-10-CM | POA: Insufficient documentation

## 2018-09-20 DIAGNOSIS — Z79899 Other long term (current) drug therapy: Secondary | ICD-10-CM | POA: Diagnosis not present

## 2018-09-20 DIAGNOSIS — R05 Cough: Secondary | ICD-10-CM | POA: Diagnosis not present

## 2018-09-20 DIAGNOSIS — J069 Acute upper respiratory infection, unspecified: Secondary | ICD-10-CM | POA: Diagnosis not present

## 2018-09-20 DIAGNOSIS — R0981 Nasal congestion: Secondary | ICD-10-CM | POA: Diagnosis not present

## 2018-09-20 LAB — GROUP A STREP BY PCR: Group A Strep by PCR: NOT DETECTED

## 2018-09-20 MED ORDER — PREDNISOLONE 15 MG/5ML PO SOLN: 30.0000 mg | Freq: Every day | ORAL | 0 refills | Status: AC

## 2018-09-20 MED ORDER — PREDNISONE 10 MG (21) PO TBPK: ORAL_TABLET | ORAL | 0 refills | Status: DC

## 2018-09-20 MED ORDER — BENZONATATE 100 MG PO CAPS: 100.0000 mg | ORAL_CAPSULE | Freq: Three times a day (TID) | ORAL | 0 refills | Status: DC

## 2018-09-20 MED FILL — BENZONATATE 100 MG CAP: 100 | 7 days supply | Qty: 21 | Fill #0

## 2018-09-20 MED FILL — predniSONE 10 MG TABS: 10 | 6 days supply | Qty: 21 | Fill #0

## 2018-09-20 NOTE — ED Provider Notes (Signed)
[facility] EMERGENCY DEPARTMENT Provider Note   CSN: [id number] Arrival date & time: 09/20/18  1137     History   Chief Complaint No chief complaint on file.   HPI Anita [name] is a 77 y.o. female.  HPI   Anita [name] is a 77 y.o. female, with a history of GERD, presenting to the ED with nasal congestion, sneezing, sore throat, and cough for about the last 6 days.  States cough has improved, but symptoms are persistent. She has been using Flonase and Xopenex. Denies fever/chills, chest pain, shortness of breath, N/V/D, abdominal pain, or any other complaints.     Past Medical History:  Diagnosis Date  . Allergy    seasonal  . Cough   . Diverticulitis   . Esophageal candidiasis ([place]) - treated 03/13/2016  . Esophageal stricture   . GERD (gastroesophageal reflux disease)   . Hx of adenomatous polyp of colon 01/09/2018  . IBS (irritable bowel syndrome)   . Kidney stones    right kidney  . Thyroid disease     Patient Active Problem List   Diagnosis Date Noted  . Hx of adenomatous polyp of colon 01/09/2018  . Bloating 10/29/2017  . Other dysphagia 05/13/2017  . Positive D dimer   . Precordial chest pain   . Chest pain 05/02/2017  . Shortness of breath 05/02/2017  . Cough 05/02/2017  . Emphysema of lung ([place]) 05/02/2017  . Esophageal candidiasis ([place]) - treated 03/13/2016  . Dysphagia, pharyngoesophageal phase 09/20/2015  . Upper airway cough syndrome 09/20/2015    Past Surgical History:  Procedure Laterality Date  . COLONOSCOPY    . ESOPHAGEAL DILATION    . FACIAL RECONSTRUCTION SURGERY    . HYSTEROTOMY    . lipoma right shoulder removal    . THYROIDECTOMY, PARTIAL    . TUBAL LIGATION    . UPPER GASTROINTESTINAL ENDOSCOPY       OB History   None      Home Medications    Prior to Admission medications   Medication Sig Start Date End Date Taking? Authorizing Provider  aspirin EC 325 MG tablet Take 325 mg by mouth as needed  (pain/headache).     [provider]  benzonatate (TESSALON) 100 MG capsule Take 1 capsule (100 mg total) by mouth every 8 (eight) hours. 09/20/18   Noella Kipnis C, PA-C  estradiol (ESTRACE) 0.1 MG/GM vaginal cream Place 0.5 g vaginally daily as needed (vaginal dryness/irritation).  03/06/17   [provider]  fluticasone (FLONASE) 50 MCG/ACT nasal spray Place 1 spray into both nostrils as needed for allergies or rhinitis.    [provider]  levalbuterol Penne Lash HFA) 45 MCG/ACT inhaler Inhale 1-2 puffs into the lungs every 4 (four) hours as needed for wheezing. 01/13/17   Lysbeth Penner, FNP  levothyroxine (SYNTHROID, LEVOTHROID) 88 MCG tablet Take 88 mcg by mouth See admin instructions. Take 1 tablet (88 mcg) by mouth with applesauce every morning    [provider]  omeprazole (PRILOSEC) 40 MG capsule Take 40 mg by mouth See admin instructions. Open capsule and sprinkle over applesauce once daily 10/06/16   [provider]  Polyvinyl Alcohol-Povidone (REFRESH OP) Apply 1 drop to eye as needed (DRY EYES).    [provider]  prednisoLONE (PRELONE) 15 MG/5ML SOLN Take 10 mLs (30 mg total) by mouth daily before breakfast for 5 days. 09/20/18 09/25/18  Lorayne Bender, PA-C    Family History Family History  Problem Relation Age of Onset  . Heart disease Mother   . Diabetes Mother   . Kidney disease Father   . Breast cancer Sister   . Breast cancer Maternal Aunt   . Liver cancer Maternal Aunt   . Diabetes Maternal Aunt   . Liver cancer Paternal Uncle   . Breast cancer Maternal Grandmother   . Diabetes Maternal Grandmother   . Colon cancer Neg Hx   . Stomach cancer Neg Hx   . Esophageal cancer Neg Hx   . Pancreatic cancer Neg Hx   . Rectal cancer Neg Hx     Social History Social History   Tobacco Use  . Smoking status: Former Smoker    Packs/day: 1.00    Years: 15.00    Pack years: 15.00    Types: Cigarettes    Last attempt to quit:  10/21/1975    Years since quitting: 42.9  . Smokeless tobacco: Never Used  Substance Use Topics  . Alcohol use: Yes    Alcohol/week: 2.0 standard drinks    Types: 2 Glasses of wine per week    Comment: 1-2 glasses of wine a week  . Drug use: No     Allergies   Clindamycin; Codeine; Minocin [minocycline]; Peanut-containing drug products; Penicillins; Sulfa antibiotics; Sulfacetamide sodium; Iohexol; Latex; and Pseudoephedrine-naproxen na er   Review of Systems Review of Systems  Constitutional: Negative for chills, diaphoresis and fever.  HENT: Positive for congestion, rhinorrhea and sore throat. Negative for trouble swallowing.   Respiratory: Positive for cough. Negative for shortness of breath.   Cardiovascular: Negative for chest pain.  Gastrointestinal: Negative for abdominal pain, diarrhea, nausea and vomiting.  All other systems reviewed and are negative.    Physical Exam Updated Vital Signs BP 125/68   Pulse 74   Temp 98.4 F (36.9 C) (Oral)   Resp 20   Ht 5\' 2"  (1.575 m)   Wt 53.5 kg   SpO2 97%   BMI 21.57 kg/m   Physical Exam  Constitutional: She appears well-developed and well-nourished. No distress.  HENT:  Head: Normocephalic and atraumatic.  Nose: Mucosal edema and rhinorrhea present. Right sinus exhibits no maxillary sinus tenderness and no frontal sinus tenderness. Left sinus exhibits no maxillary sinus tenderness and no frontal sinus tenderness.  Eyes: Conjunctivae are normal.  Neck: Neck supple.  Cardiovascular: Normal rate, regular rhythm, normal heart sounds and intact distal pulses.  Pulmonary/Chest: Effort normal and breath sounds normal. No respiratory distress.  No increased work of breathing.  Speaks in full sentences without difficulty.  Abdominal: Soft. There is no tenderness. There is no guarding.  Musculoskeletal: She exhibits no edema.  Lymphadenopathy:    She has no cervical adenopathy.  Neurological: She is alert.  Skin: Skin is warm  and dry. She is not diaphoretic.  Psychiatric: She has a normal mood and affect. Her behavior is normal.  Nursing note and vitals reviewed.    ED Treatments / Results  Labs (all labs ordered are listed, but only abnormal results are displayed) Labs Reviewed  GROUP A STREP BY PCR    EKG None  Radiology Dg Chest 2 View  Result Date: 09/20/2018 CLINICAL DATA:  Upper respiratory infection. EXAM: CHEST - 2 VIEW COMPARISON:  09/15/2018 FINDINGS: The cardiac silhouette, mediastinal and hilar contours are stable. Mild tortuosity and calcification of the thoracic aorta. Stable emphysematous changes with hyperinflation and areas of pulmonary scarring. No acute overlying pulmonary process. No pleural effusion. The bony thorax is intact. IMPRESSION:  Stable emphysematous changes but no acute overlying pulmonary process. Electronically Signed   By: Marijo Sanes M.D.   On: 09/20/2018 13:16    Procedures Procedures (including critical care time)  Medications Ordered in ED Medications - No data to display   Initial Impression / Assessment and Plan / ED Course  I have reviewed the triage vital signs and the nursing notes.  Pertinent labs & imaging results that were available during my care of the patient were reviewed by me and considered in my medical decision making (see chart for details).     Patient presents with upper respiratory symptoms.  Timeline and symptoms are still consistent with viral syndrome.  Repeat chest x-ray negative.  Rapid strep negative.  PCP follow-up. The patient was given instructions for home care as well as return precautions. Patient voices understanding of these instructions, accepts the plan, and is comfortable with discharge.   Findings and plan of care discussed with Lennice Sites, DO.   Final Clinical Impressions(s) / ED Diagnoses   Final diagnoses:  Cough  Nasal congestion  Sore throat    ED Discharge Orders         Ordered    benzonatate  (TESSALON) 100 MG capsule  Every 8 hours,   Status:  Discontinued     09/20/18 1338    predniSONE (STERAPRED UNI-PAK 21 TAB) 10 MG (21) TBPK tablet  Status:  Discontinued     09/20/18 1338    benzonatate (TESSALON) 100 MG capsule  Every 8 hours     09/20/18 1405    prednisoLONE (PRELONE) 15 MG/5ML SOLN  Daily before breakfast     09/20/18 1405           Lorayne Bender, PA-C 09/20/18 [place], [place], DO 09/20/18 1527

## 2018-09-20 NOTE — Discharge Instructions (Addendum)
Your symptoms are likely consistent with a viral illness. Viruses do not require or respond to antibiotics. Treatment is symptomatic care and it is important to note that these symptoms may last for 7-14 days.   Hand washing: Wash your hands throughout the day, but especially before and after touching the face, using the restroom, sneezing, coughing, or touching surfaces that have been coughed or sneezed upon. Hydration: Symptoms will be intensified and complicated by dehydration. Dehydration can also extend the duration of symptoms. Drink plenty of fluids and get plenty of rest. You should be drinking at least half a liter of water an hour to stay hydrated. Electrolyte drinks (ex. Gatorade, Powerade, Pedialyte) are also encouraged. You should be drinking enough fluids to make your urine light yellow, almost clear. If this is not the case, you are not drinking enough water. Please note that some of the treatments indicated below will not be effective if you are not adequately hydrated. Cough: Use the benzonatate (generic for Tessalon) for cough.  Prednisolone: Take the prednisolone, as directed, in its entirety. Zyrtec or Claritin: May add these medication daily to control underlying symptoms of congestion, sneezing, and other signs of allergies.  These medications are available over-the-counter. Generics: Cetirizine (generic for Zyrtec) and loratadine (generic for Claritin). Fluticasone: Use fluticasone (generic for Flonase), as directed, for nasal and sinus congestion.  This medication is available over-the-counter. Congestion: Saline sinus rinses and saline nasal sprays may also help relieve congestion.  Sore throat: Warm liquids or Chloraseptic spray may help soothe a sore throat. Gargle twice a day with a salt water solution made from a half teaspoon of salt in a cup of warm water.  Follow up: Follow up with a primary care provider within the next two weeks should symptoms fail to resolve. Return:  Return to the ED for significantly worsening symptoms, shortness of breath, persistent vomiting, large amounts of blood in stool, or any other major concerns.  For prescription assistance, may try using prescription discount sites or apps, such as goodrx.com

## 2018-09-20 NOTE — ED Triage Notes (Signed)
She was seen 5 days ago for URI. Here today not feeling any better.

## 2018-09-28 DIAGNOSIS — N9411 Superficial (introital) dyspareunia: Secondary | ICD-10-CM | POA: Diagnosis not present

## 2018-09-28 DIAGNOSIS — N952 Postmenopausal atrophic vaginitis: Secondary | ICD-10-CM | POA: Diagnosis not present

## 2018-09-28 DIAGNOSIS — R1032 Left lower quadrant pain: Secondary | ICD-10-CM | POA: Diagnosis not present

## 2018-09-28 DIAGNOSIS — N905 Atrophy of vulva: Secondary | ICD-10-CM | POA: Diagnosis not present

## 2018-09-28 DIAGNOSIS — R1031 Right lower quadrant pain: Secondary | ICD-10-CM | POA: Diagnosis not present

## 2018-09-28 DIAGNOSIS — Z79890 Hormone replacement therapy (postmenopausal): Secondary | ICD-10-CM | POA: Diagnosis not present

## 2018-10-14 DIAGNOSIS — J209 Acute bronchitis, unspecified: Secondary | ICD-10-CM | POA: Diagnosis not present

## 2018-10-14 DIAGNOSIS — J9801 Acute bronchospasm: Secondary | ICD-10-CM | POA: Diagnosis not present

## 2018-11-11 DIAGNOSIS — H6122 Impacted cerumen, left ear: Secondary | ICD-10-CM | POA: Diagnosis not present

## 2018-11-11 DIAGNOSIS — J0100 Acute maxillary sinusitis, unspecified: Secondary | ICD-10-CM | POA: Diagnosis not present

## 2018-11-11 DIAGNOSIS — H9202 Otalgia, left ear: Secondary | ICD-10-CM | POA: Diagnosis not present

## 2018-11-17 DIAGNOSIS — K5730 Diverticulosis of large intestine without perforation or abscess without bleeding: Secondary | ICD-10-CM | POA: Diagnosis not present

## 2018-11-17 DIAGNOSIS — N200 Calculus of kidney: Secondary | ICD-10-CM | POA: Diagnosis not present

## 2018-11-17 DIAGNOSIS — N202 Calculus of kidney with calculus of ureter: Secondary | ICD-10-CM | POA: Diagnosis not present

## 2018-12-12 DIAGNOSIS — R05 Cough: Secondary | ICD-10-CM | POA: Diagnosis not present

## 2018-12-12 DIAGNOSIS — J181 Lobar pneumonia, unspecified organism: Secondary | ICD-10-CM | POA: Diagnosis not present

## 2019-04-11 ENCOUNTER — Encounter (HOSPITAL_COMMUNITY): Payer: Self-pay

## 2019-04-11 ENCOUNTER — Emergency Department (HOSPITAL_COMMUNITY)
Admission: EM | Admit: 2019-04-11 | Discharge: 2019-04-11 | Disposition: A | Discharge status: Home / Self Care | Payer: PPO | Attending: Emergency Medicine | Admitting: Emergency Medicine

## 2019-04-11 ENCOUNTER — Other Ambulatory Visit: Payer: Self-pay

## 2019-04-11 ENCOUNTER — Emergency Department (HOSPITAL_COMMUNITY): Payer: PPO

## 2019-04-11 DIAGNOSIS — R05 Cough: Secondary | ICD-10-CM | POA: Diagnosis not present

## 2019-04-11 DIAGNOSIS — Z7982 Long term (current) use of aspirin: Secondary | ICD-10-CM | POA: Diagnosis not present

## 2019-04-11 DIAGNOSIS — Z79899 Other long term (current) drug therapy: Secondary | ICD-10-CM | POA: Diagnosis not present

## 2019-04-11 DIAGNOSIS — R809 Proteinuria, unspecified: Secondary | ICD-10-CM | POA: Diagnosis not present

## 2019-04-11 DIAGNOSIS — E079 Disorder of thyroid, unspecified: Secondary | ICD-10-CM | POA: Diagnosis not present

## 2019-04-11 DIAGNOSIS — B9789 Other viral agents as the cause of diseases classified elsewhere: Secondary | ICD-10-CM | POA: Diagnosis not present

## 2019-04-11 DIAGNOSIS — Z20828 Contact with and (suspected) exposure to other viral communicable diseases: Secondary | ICD-10-CM | POA: Diagnosis not present

## 2019-04-11 DIAGNOSIS — U071 COVID-19: Secondary | ICD-10-CM | POA: Diagnosis not present

## 2019-04-11 DIAGNOSIS — J069 Acute upper respiratory infection, unspecified: Secondary | ICD-10-CM | POA: Insufficient documentation

## 2019-04-11 DIAGNOSIS — J988 Other specified respiratory disorders: Secondary | ICD-10-CM | POA: Diagnosis not present

## 2019-04-11 DIAGNOSIS — J1281 Pneumonia due to SARS-associated coronavirus: Secondary | ICD-10-CM | POA: Diagnosis not present

## 2019-04-11 DIAGNOSIS — J439 Emphysema, unspecified: Secondary | ICD-10-CM | POA: Insufficient documentation

## 2019-04-11 DIAGNOSIS — R918 Other nonspecific abnormal finding of lung field: Secondary | ICD-10-CM | POA: Diagnosis not present

## 2019-04-11 DIAGNOSIS — J189 Pneumonia, unspecified organism: Secondary | ICD-10-CM | POA: Diagnosis not present

## 2019-04-11 DIAGNOSIS — R109 Unspecified abdominal pain: Secondary | ICD-10-CM | POA: Diagnosis not present

## 2019-04-11 DIAGNOSIS — Z87891 Personal history of nicotine dependence: Secondary | ICD-10-CM | POA: Insufficient documentation

## 2019-04-11 DIAGNOSIS — R509 Fever, unspecified: Secondary | ICD-10-CM | POA: Diagnosis not present

## 2019-04-11 NOTE — ED Provider Notes (Signed)
[facility] EMERGENCY DEPARTMENT Provider Note   CSN: [id number] Arrival date & time: 04/11/19  1743    History   Chief Complaint Chief Complaint  Patient presents with  . covid exposure    HPI Anita [name] is a 78 y.o. female.     HPI   78 year old female presents today with complaints of fatigue.  Patient notes proximate 3 days ago she developed fatigue, loss of smell and taste, nausea generalized abdominal discomfort and fever.  She notes a fever 100.2 at urgent care today.  She denies any shortness of breath cough or chest pain. (Documentation shows that she was having cough, she denies any cough).  She reports she was seen at urgent care today had a chest x-ray showing multifocal pneumonia and was referred to the emergency room for further evaluation.  She did have Cob[employer] testing that was sent out at that time.  She notes her husband is sick with similar symptoms.  She notes she is a former smoker, noting she quit approximately 30 years ago.  She denies any other significant chronic health conditions.  Past Medical History:  Diagnosis Date  . Allergy    seasonal  . Cough   . Diverticulitis   . Esophageal candidiasis ([place]) - treated 03/13/2016  . Esophageal stricture   . GERD (gastroesophageal reflux disease)   . Hx of adenomatous polyp of colon 01/09/2018  . IBS (irritable bowel syndrome)   . Kidney stones    right kidney  . Thyroid disease     Patient Active Problem List   Diagnosis Date Noted  . Hx of adenomatous polyp of colon 01/09/2018  . Bloating 10/29/2017  . Other dysphagia 05/13/2017  . Positive D dimer   . Precordial chest pain   . Chest pain 05/02/2017  . Shortness of breath 05/02/2017  . Cough 05/02/2017  . Emphysema of lung ([place]) 05/02/2017  . Esophageal candidiasis ([place]) - treated 03/13/2016  . Dysphagia, pharyngoesophageal phase 09/20/2015  . Upper airway cough syndrome 09/20/2015    Past Surgical History:  Procedure  Laterality Date  . COLONOSCOPY    . ESOPHAGEAL DILATION    . FACIAL RECONSTRUCTION SURGERY    . HYSTEROTOMY    . lipoma right shoulder removal    . THYROIDECTOMY, PARTIAL    . TUBAL LIGATION    . UPPER GASTROINTESTINAL ENDOSCOPY       OB History   No obstetric history on file.      Home Medications    Prior to Admission medications   Medication Sig Start Date End Date Taking? Authorizing Provider  aspirin EC 325 MG tablet Take 325 mg by mouth as needed (pain/headache).     [provider]  benzonatate (TESSALON) 100 MG capsule Take 1 capsule (100 mg total) by mouth every 8 (eight) hours. 09/20/18   Joy, Shawn C, PA-C  estradiol (ESTRACE) 0.1 MG/GM vaginal cream Place 0.5 g vaginally daily as needed (vaginal dryness/irritation).  03/06/17   [provider]  fluticasone (FLONASE) 50 MCG/ACT nasal spray Place 1 spray into both nostrils as needed for allergies or rhinitis.    [provider]  levalbuterol Penne Lash HFA) 45 MCG/ACT inhaler Inhale 1-2 puffs into the lungs every 4 (four) hours as needed for wheezing. 01/13/17   Lysbeth Penner, FNP  levothyroxine (SYNTHROID, LEVOTHROID) 88 MCG tablet Take 88 mcg by mouth See admin instructions. Take 1 tablet (88 mcg) by mouth with applesauce every morning    [provider]  omeprazole (PRILOSEC) 40 MG capsule Take 40 mg by mouth See admin instructions. Open capsule and sprinkle over applesauce once daily 10/06/16   [provider]  Polyvinyl Alcohol-Povidone (REFRESH OP) Apply 1 drop to eye as needed (DRY EYES).    [provider]    Family History Family History  Problem Relation Age of Onset  . Heart disease Mother   . Diabetes Mother   . Kidney disease Father   . Breast cancer Sister   . Breast cancer Maternal Aunt   . Liver cancer Maternal Aunt   . Diabetes Maternal Aunt   . Liver cancer Paternal Uncle   . Breast cancer Maternal Grandmother   . Diabetes Maternal Grandmother    . Colon cancer Neg Hx   . Stomach cancer Neg Hx   . Esophageal cancer Neg Hx   . Pancreatic cancer Neg Hx   . Rectal cancer Neg Hx     Social History Social History   Tobacco Use  . Smoking status: Former Smoker    Packs/day: 1.00    Years: 15.00    Pack years: 15.00    Types: Cigarettes    Quit date: 10/21/1975    Years since quitting: 43.5  . Smokeless tobacco: Never Used  Substance Use Topics  . Alcohol use: Yes    Alcohol/week: 2.0 standard drinks    Types: 2 Glasses of wine per week    Comment: 1-2 glasses of wine a week  . Drug use: No     Allergies   Clindamycin, Codeine, Minocin [minocycline], Peanut-containing drug products, Penicillins, Sulfa antibiotics, Sulfacetamide sodium, Iohexol, Latex, and Pseudoephedrine-naproxen na er   Review of Systems Review of Systems  All other systems reviewed and are negative.    Physical Exam Updated Vital Signs BP 109/77   Pulse 81   Temp 98.2 F (36.8 C) (Oral)   Resp (!) 21   SpO2 97%   Physical Exam Vitals signs and nursing note reviewed.  Constitutional:      Appearance: She is well-developed.  HENT:     Head: Normocephalic and atraumatic.  Eyes:     General: No scleral icterus.       Right eye: No discharge.        Left eye: No discharge.     Conjunctiva/sclera: Conjunctivae normal.     Pupils: Pupils are equal, round, and reactive to light.  Neck:     Musculoskeletal: Normal range of motion.     Vascular: No JVD.     Trachea: No tracheal deviation.  Pulmonary:     Effort: Pulmonary effort is normal. No respiratory distress.     Breath sounds: Normal breath sounds. No stridor. No wheezing, rhonchi or rales.  Abdominal:     General: Abdomen is flat. There is no distension.     Palpations: Abdomen is soft.     Comments: Generalized tenderness nonfocal  Neurological:     Mental Status: She is alert and oriented to person, place, and time.     Coordination: Coordination normal.  Psychiatric:         Behavior: Behavior normal.        Thought Content: Thought content normal.        Judgment: Judgment normal.      ED Treatments / Results  Labs (all labs ordered are listed, but only abnormal results are displayed) Labs Reviewed - No data to display  EKG None  Radiology Dg Chest Portable 1 View  Result  Date: 04/11/2019 CLINICAL DATA:  Patient reports COVID-19 exposure, was seen at a doctor's office with chest x-ray demonstrating pneumonia. Nausea. EXAM: PORTABLE CHEST 1 VIEW COMPARISON:  09/20/2018 FINDINGS: Bilateral heterogeneous opacities in the peripheral and basilar predominant distribution. Borderline cardiomegaly with aortic atherosclerosis. No pulmonary edema. No pleural fluid or pneumothorax. No acute osseous abnormalities are seen. IMPRESSION: 1. Mild bilateral heterogeneous opacities in the peripheral and basilar predominant distribution, pattern consistent with COVID-19 pneumonia. 2. Borderline cardiomegaly with Aortic Atherosclerosis (ICD10-I70.0). Electronically Signed   By: Keith Rake M.D.   On: 04/11/2019 20:11    Procedures Procedures (including critical care time)  Medications Ordered in ED Medications - No data to display   Initial Impression / Assessment and Plan / ED Course  I have reviewed the triage vital signs and the nursing notes.  Pertinent labs & imaging results that were available during my care of the patient were reviewed by me and considered in my medical decision making (see chart for details).        Labs:   Imaging: Portable chest  Consults:  Therapeutics:  Discharge Meds:   Assessment/Plan: 78 year old female presents today with likely viral illness.  I have high suspicion for COVID-19 given her loss of taste, loss of smell, chest x-ray findings.  She overall looks very impressive with no significant subjective respiratory involvement.  She has no significant past medical history.  Given the high suspicion for COVID-19 and her  overall well appearance I see no criteria for admission at this time.  I will ambulate her to assure that she has no drop in her oxygen saturation, repeat chest x-ray here will be evaluated.  I discussed the case with attending physician Verner Chol who agrees that patient would be stable for outpatient given her reported symptoms and overall reassuring work-up.  I discussed with the patient that although her symptoms are very minor now she is early on in the course and these can change dramatically quickly.  She will return immediately she develops any new or worsening signs or symptoms.  She verbalized her understanding and agreement to today's plan had no further questions or concerns at the time of discharge.   Patient was ambulated with no drop in oxygen saturation or shortness of breath.  She is stable for charge at this time.     Final Clinical Impressions(s) / ED Diagnoses   Final diagnoses:  Viral respiratory infection    ED Discharge Orders    None       Francee Gentile 04/11/19 2041    Sherwood Gambler, MD 04/11/19 2342

## 2019-04-11 NOTE — ED Notes (Signed)
P[t given gingerale, phone is close to her

## 2019-04-11 NOTE — Discharge Instructions (Addendum)
Please read attached information. If you experience any new or worsening signs or symptoms please return to the emergency room for evaluation. Please follow-up with your primary care provider or specialist as discussed.  °

## 2019-04-11 NOTE — ED Notes (Signed)
ED Provider at bedside. 

## 2019-04-11 NOTE — ED Triage Notes (Signed)
Pt states has been exposed to covid. Pt went to doctor's office and they did chest x-ray and told pt that she had pneumonia and needed to come to emergency room. Pt endorses nausea. Denies chest pain, SOB, cough, fever.

## 2019-04-23 DIAGNOSIS — J3089 Other allergic rhinitis: Secondary | ICD-10-CM | POA: Diagnosis not present

## 2019-04-23 DIAGNOSIS — R062 Wheezing: Secondary | ICD-10-CM | POA: Diagnosis not present

## 2019-05-16 DIAGNOSIS — E039 Hypothyroidism, unspecified: Secondary | ICD-10-CM | POA: Diagnosis not present

## 2019-05-16 DIAGNOSIS — R1314 Dysphagia, pharyngoesophageal phase: Secondary | ICD-10-CM | POA: Diagnosis not present

## 2019-05-16 DIAGNOSIS — K219 Gastro-esophageal reflux disease without esophagitis: Secondary | ICD-10-CM | POA: Diagnosis not present

## 2019-05-16 DIAGNOSIS — F458 Other somatoform disorders: Secondary | ICD-10-CM | POA: Diagnosis not present

## 2019-06-22 DIAGNOSIS — Z1231 Encounter for screening mammogram for malignant neoplasm of breast: Secondary | ICD-10-CM | POA: Diagnosis not present

## 2019-06-22 DIAGNOSIS — Z803 Family history of malignant neoplasm of breast: Secondary | ICD-10-CM | POA: Diagnosis not present

## 2019-07-10 DIAGNOSIS — R0602 Shortness of breath: Secondary | ICD-10-CM | POA: Diagnosis not present

## 2019-07-10 DIAGNOSIS — R05 Cough: Secondary | ICD-10-CM | POA: Diagnosis not present

## 2019-07-15 DIAGNOSIS — J209 Acute bronchitis, unspecified: Secondary | ICD-10-CM | POA: Diagnosis not present

## 2019-07-15 DIAGNOSIS — R0989 Other specified symptoms and signs involving the circulatory and respiratory systems: Secondary | ICD-10-CM | POA: Diagnosis not present

## 2019-08-11 DIAGNOSIS — E039 Hypothyroidism, unspecified: Secondary | ICD-10-CM | POA: Diagnosis not present

## 2019-09-30 DIAGNOSIS — Z961 Presence of intraocular lens: Secondary | ICD-10-CM | POA: Diagnosis not present

## 2019-09-30 DIAGNOSIS — H25811 Combined forms of age-related cataract, right eye: Secondary | ICD-10-CM | POA: Diagnosis not present

## 2020-01-05 DIAGNOSIS — Z961 Presence of intraocular lens: Secondary | ICD-10-CM | POA: Diagnosis not present

## 2020-01-05 DIAGNOSIS — H25811 Combined forms of age-related cataract, right eye: Secondary | ICD-10-CM | POA: Diagnosis not present

## 2020-01-05 DIAGNOSIS — H2511 Age-related nuclear cataract, right eye: Secondary | ICD-10-CM | POA: Diagnosis not present

## 2020-01-05 DIAGNOSIS — Z01818 Encounter for other preprocedural examination: Secondary | ICD-10-CM | POA: Diagnosis not present

## 2020-04-16 DIAGNOSIS — R079 Chest pain, unspecified: Secondary | ICD-10-CM | POA: Diagnosis not present

## 2020-04-16 DIAGNOSIS — J449 Chronic obstructive pulmonary disease, unspecified: Secondary | ICD-10-CM | POA: Diagnosis not present

## 2020-05-04 DIAGNOSIS — I700 Atherosclerosis of aorta: Secondary | ICD-10-CM | POA: Diagnosis not present

## 2020-05-04 DIAGNOSIS — R911 Solitary pulmonary nodule: Secondary | ICD-10-CM | POA: Diagnosis not present

## 2020-05-04 DIAGNOSIS — J9811 Atelectasis: Secondary | ICD-10-CM | POA: Diagnosis not present

## 2020-05-04 DIAGNOSIS — J439 Emphysema, unspecified: Secondary | ICD-10-CM | POA: Diagnosis not present

## 2020-05-04 DIAGNOSIS — J430 Unilateral pulmonary emphysema [MacLeod's syndrome]: Secondary | ICD-10-CM | POA: Diagnosis not present

## 2020-09-05 DIAGNOSIS — R350 Frequency of micturition: Secondary | ICD-10-CM | POA: Diagnosis not present

## 2020-09-05 DIAGNOSIS — R1314 Dysphagia, pharyngoesophageal phase: Secondary | ICD-10-CM | POA: Diagnosis not present

## 2020-09-05 DIAGNOSIS — E039 Hypothyroidism, unspecified: Secondary | ICD-10-CM | POA: Diagnosis not present

## 2020-09-05 DIAGNOSIS — N3000 Acute cystitis without hematuria: Secondary | ICD-10-CM | POA: Diagnosis not present

## 2020-09-05 DIAGNOSIS — K219 Gastro-esophageal reflux disease without esophagitis: Secondary | ICD-10-CM | POA: Diagnosis not present

## 2020-09-21 DIAGNOSIS — Z1231 Encounter for screening mammogram for malignant neoplasm of breast: Secondary | ICD-10-CM | POA: Diagnosis not present

## 2020-09-21 DIAGNOSIS — Z803 Family history of malignant neoplasm of breast: Secondary | ICD-10-CM | POA: Diagnosis not present

## 2021-01-23 DIAGNOSIS — R059 Cough, unspecified: Secondary | ICD-10-CM | POA: Diagnosis not present

## 2021-01-23 DIAGNOSIS — R9389 Abnormal findings on diagnostic imaging of other specified body structures: Secondary | ICD-10-CM | POA: Diagnosis not present

## 2021-01-23 DIAGNOSIS — R350 Frequency of micturition: Secondary | ICD-10-CM | POA: Diagnosis not present

## 2021-01-23 DIAGNOSIS — R591 Generalized enlarged lymph nodes: Secondary | ICD-10-CM | POA: Diagnosis not present

## 2021-05-02 DIAGNOSIS — R911 Solitary pulmonary nodule: Secondary | ICD-10-CM | POA: Diagnosis not present

## 2021-05-02 DIAGNOSIS — R933 Abnormal findings on diagnostic imaging of other parts of digestive tract: Secondary | ICD-10-CM | POA: Diagnosis not present

## 2021-05-02 DIAGNOSIS — R918 Other nonspecific abnormal finding of lung field: Secondary | ICD-10-CM | POA: Diagnosis not present

## 2021-05-02 DIAGNOSIS — R9389 Abnormal findings on diagnostic imaging of other specified body structures: Secondary | ICD-10-CM | POA: Diagnosis not present

## 2021-05-02 DIAGNOSIS — I700 Atherosclerosis of aorta: Secondary | ICD-10-CM | POA: Diagnosis not present

## 2021-05-02 DIAGNOSIS — J439 Emphysema, unspecified: Secondary | ICD-10-CM | POA: Diagnosis not present

## 2021-05-02 DIAGNOSIS — J432 Centrilobular emphysema: Secondary | ICD-10-CM | POA: Diagnosis not present

## 2021-05-07 DIAGNOSIS — J069 Acute upper respiratory infection, unspecified: Secondary | ICD-10-CM | POA: Diagnosis not present

## 2021-05-07 DIAGNOSIS — E039 Hypothyroidism, unspecified: Secondary | ICD-10-CM | POA: Diagnosis not present

## 2021-05-07 DIAGNOSIS — R059 Cough, unspecified: Secondary | ICD-10-CM | POA: Diagnosis not present

## 2021-05-27 ENCOUNTER — Encounter: Payer: Self-pay | Admitting: Internal Medicine

## 2021-06-08 DIAGNOSIS — N200 Calculus of kidney: Secondary | ICD-10-CM | POA: Diagnosis not present

## 2021-06-08 DIAGNOSIS — R309 Painful micturition, unspecified: Secondary | ICD-10-CM | POA: Diagnosis not present

## 2021-06-10 DIAGNOSIS — I700 Atherosclerosis of aorta: Secondary | ICD-10-CM | POA: Diagnosis not present

## 2021-06-10 DIAGNOSIS — Z90710 Acquired absence of both cervix and uterus: Secondary | ICD-10-CM | POA: Diagnosis not present

## 2021-06-10 DIAGNOSIS — N200 Calculus of kidney: Secondary | ICD-10-CM | POA: Diagnosis not present

## 2021-06-10 DIAGNOSIS — N202 Calculus of kidney with calculus of ureter: Secondary | ICD-10-CM | POA: Diagnosis not present

## 2021-08-06 ENCOUNTER — Telehealth: Payer: Self-pay | Admitting: Internal Medicine

## 2021-08-06 NOTE — Telephone Encounter (Signed)
Left Message to call Back 08/08/21

## 2021-08-06 NOTE — Telephone Encounter (Signed)
OK to leave message on the home number.  Having issues with pain in right side, swollen, can't "finish" when going to bathroom, soreness down right side-almost like a blockage. Gaining fluid weight.  Please call to advise.

## 2021-08-08 NOTE — Telephone Encounter (Signed)
Left message to call back 08/09/2021

## 2021-08-08 NOTE — Telephone Encounter (Signed)
Patient returned call. Best contact number [phone]

## 2021-08-09 NOTE — Telephone Encounter (Signed)
Pt states that she is having Swelling in abdomen after eating; gained 8 lbs in last 3 weeks, abdominal pain at times: Nausea at times.  Appointment was scheduled to see Tye Savoy NP on 08/16/2021 @ 1:30  Pt made aware Pt verbalized understanding with all questions answered.

## 2021-08-11 DIAGNOSIS — H6123 Impacted cerumen, bilateral: Secondary | ICD-10-CM | POA: Diagnosis not present

## 2021-08-16 ENCOUNTER — Encounter: Payer: Self-pay | Admitting: Nurse Practitioner

## 2021-08-16 ENCOUNTER — Ambulatory Visit: Payer: PPO | Admitting: Nurse Practitioner

## 2021-08-16 VITALS — BP 136/74 | HR 73 | Ht 61.5 in | Wt 129.1 lb

## 2021-08-16 DIAGNOSIS — K5909 Other constipation: Secondary | ICD-10-CM

## 2021-08-16 DIAGNOSIS — R1031 Right lower quadrant pain: Secondary | ICD-10-CM

## 2021-08-16 DIAGNOSIS — Z86010 Personal history of colonic polyps: Secondary | ICD-10-CM

## 2021-08-16 NOTE — Progress Notes (Addendum)
ASSESSMENT AND PLAN    # 80 yo female with 2 months history of RLQ / mid lower abdominal pain with associated abdominal bloating and weight gain of ~ 7 pounds. Evaluation by PCP included unremarkable UA and abdominal films. Imaging ( CTAP?) by Urology was unrevealing per patient. She does have chronic constipation so need to consider that as a contributing factor. Of note,  and likely an incidental finding, a chest CT scan in July ( one to follow up on pulmonary nodules) showed mild stranding adjacent to the splenic flexure ( no where near the area she is hurting but still needs evauation.  --CMP, CBC --Start daily Miralax --Get CT scan from [facility] ( was likely non-contrast) --Schedule for colonoscopy (mainly given history of large TVA in March 2019).    Addendum.  Received noncontrast CTAP from [facility] completed 06/10/2021.  Findings included a nonobstructing right renal calculus.  Aortic atherosclerosis.  Hysterectomy.  Adnexa unremarkable.  No hepatobiliary or pancreatic abnormalities reported  HISTORY OF PRESENT ILLNESS     Chief Complaint : Received a letter it was time for her colonoscopy, also having abdominal pain  Anita [name] is a 80 y.o. female known to Dr. Carlean [name], last seen in 2019 . Past medical history significant for diverticulosis,  colonic tubulovillous adenoma, hypothyroidism, emphysema see PMH below for any additional history.   Patient was last seen in March 2019 at the time of colonoscopy done for bowel changes.  An 18 mm tubulovillous adenoma was removed. Other findings included severe sigmoid diverticulosis, and an anal stricture.  It was suggested that she have a 3-year polyp surveillance colonoscopy and she recently received a recall letter.   Ms Anita [name] is still working everyday in [employer]. She called office on 10/18 with complaints of abdominal swelling after meals and abdominal pain. She has been having RLQ and mid lower abdominal pain  since July.  Around that time she had a chest CT scan which showed some abnormalities around the splenic flexure.  She brings in a paper copy of the report which is also available in [place] .    CT chest without contrast IMPRESSION:  1. No acute intrathoracic pathology.  2. Several small pulmonary nodules, likely post inflammatory. No  follow-up needed if patient is low-risk (and has no known or  suspected primary neoplasm). Non-contrast chest CT can be considered  in 12 months if patient is high-risk. This recommendation follows  the consensus statement: Guidelines for Management of Incidental  Pulmonary Nodules Detected on CT Images: From the Fleischner Society  2017; Radiology 2017; 284:228-243.  3. Background of centrilobular emphysema.  4. Mild stranding adjacent to the splenic flexure which may be  chronic. Clinical correlation is recommended to evaluate for  possibility of colitis.  5. Aortic Atherosclerosis (ICD10-I70.0) and Emphysema (ICD10-J43.9).    Pain involves RLQ and lower mid abdomen. The details / timeline of evaluation are a bit unorganized. The best I can tell she was seen by PCP in August, there was concern that her lower abdominal pain could be due to known history of kidney stones.  Records reviewed in [place] . An  abdominal x-ray 06/08/2021 showed gas-filled small and large bowel throughout the abdomen in a nonobstructive pattern.  Urinalysis was unremarkable. She went to see her Urologist ,Dr. Tresa [name] who did a scan and told her the pain was not related to the kidney stone.   The lower abdominal pain is not related to activity  or position and it doesn't improve with bowel movements. She does have chronic constipation. She takes daily flax seed ,  MOM, Dulcolax but only as needed. sometimes. She complains of excessive abdominal bloating and a 7 pound weight gain. Says she has never had a belly before. .    Data Reviewed: TSH 2.42 July 2022   PREVIOUS  GI EVALUATIONS:   Colonoscopy March 2019 Complete exam, excellent prep Anal stricture found on digital rectal exam. - One 18 mm polyp in the descending colon, removed with a hot snare. Resected and retrieved. - Severe diverticulosis in the sigmoid colon. There was narrowing of the colon in association with the diverticular opening. - The examination was otherwise normal on direct and retroflexion views.    Past Medical History:  Diagnosis Date   Allergy    seasonal   Cough    Diverticulitis    Esophageal candidiasis ([place]) - treated 03/13/2016   Esophageal stricture    GERD (gastroesophageal reflux disease)    Hx of adenomatous polyp of colon 01/09/2018   IBS (irritable bowel syndrome)    Kidney stones    right kidney   Thyroid disease      Past Surgical History:  Procedure Laterality Date   COLONOSCOPY     ESOPHAGEAL DILATION     FACIAL RECONSTRUCTION SURGERY     HYSTEROTOMY     lipoma right shoulder removal     THYROIDECTOMY, PARTIAL     TUBAL LIGATION     UPPER GASTROINTESTINAL ENDOSCOPY     Family History  Problem Relation Age of Onset   Heart disease Mother    Diabetes Mother    Kidney disease Father    Breast cancer Sister    Breast cancer Maternal Aunt    Liver cancer Maternal Aunt    Diabetes Maternal Aunt    Liver cancer Paternal Uncle    Breast cancer Maternal Grandmother    Diabetes Maternal Grandmother    Colon cancer Neg Hx    Stomach cancer Neg Hx    Esophageal cancer Neg Hx    Pancreatic cancer Neg Hx    Rectal cancer Neg Hx    Social History   Tobacco Use   Smoking status: Former    Packs/day: 1.00    Years: 15.00    Pack years: 15.00    Types: Cigarettes    Quit date: 10/21/1975    Years since quitting: 45.8   Smokeless tobacco: Never  Vaping Use   Vaping Use: Never used  Substance Use Topics   Alcohol use: Yes    Alcohol/week: 2.0 standard drinks    Types: 2 Glasses of wine per week    Comment: 1-2 glasses of wine a week   Drug  use: No   Current Outpatient Medications  Medication Sig Dispense Refill   aspirin EC 325 MG tablet Take 325 mg by mouth as needed (pain/headache).      fluticasone (FLONASE) 50 MCG/ACT nasal spray Place 1 spray into both nostrils as needed for allergies or rhinitis.     levalbuterol (XOPENEX HFA) 45 MCG/ACT inhaler Inhale 1-2 puffs into the lungs every 4 (four) hours as needed for wheezing. 1 Inhaler 1   omeprazole (PRILOSEC) 40 MG capsule Take 40 mg by mouth See admin instructions. Open capsule and sprinkle over applesauce once daily     SYNTHROID 75 MCG tablet Take 75 mcg by mouth every morning.     No current facility-administered medications for this visit.   Allergies  Allergen Reactions   Clindamycin Nausea Only and Nausea And Vomiting    C-Diff, Stomach bloating    Codeine Anaphylaxis   Minocin [Minocycline] Anaphylaxis   Peanut-Containing Drug Products Anaphylaxis   Penicillins Anaphylaxis    Has patient had a PCN reaction causing immediate rash, facial/tongue/throat swelling, SOB or lightheadedness with hypotension: YES Has patient had a PCN reaction causing severe rash involving mucus membranes or skin nec  Has patient had a PCN reaction that required hospitalization NO Has patient had a PCN reaction occurring within the last 10 years: NO If all of the above answers are "NO", then may proceed with Cephalosporin use.   Sulfa Antibiotics Anaphylaxis   Sulfacetamide Sodium Anaphylaxis   Iohexol Other (See Comments)     Desc: ALLERGIC TO IV CONTRAST-- RESPIRATORY ARREST    Latex Swelling   Pseudoephedrine-Naproxen Na Er Other (See Comments)    Quit breathing     Review of Systems: Positive for allergy, sinus trouble, back pain and sleeping problems.  All other systems reviewed and negative except where noted in HPI.    PHYSICAL EXAM :    Wt Readings from Last 3 Encounters:  08/16/21 129 lb 2 oz (58.6 kg)  09/20/18 117 lb 15.1 oz (53.5 kg)  09/15/18 118 lb (53.5 kg)     Ht 5' 1.5" (1.562 m)   Wt 129 lb 2 oz (58.6 kg)   BMI 24.00 kg/m  Constitutional:  Generally well appearing female in no acute distress. Psychiatric: Pleasant. Normal mood and affect. Behavior is normal. EENT: Pupils normal.  Conjunctivae are normal. No scleral icterus. Neck supple.  Cardiovascular: Normal rate, regular rhythm. No edema Pulmonary/chest: Effort normal and breath sounds normal. No wheezing, rales or rhonchi. Abdominal: Soft, nondistended, mild to moderate RLQ tenderness.  Bowel sounds active throughout. There are no masses palpable. No hepatomegaly. Neurological: Alert and oriented to person place and time. Skin: Skin is warm and dry. No rashes noted.  Tye Savoy, NP  08/16/2021, 1:40 PM

## 2021-08-16 NOTE — Patient Instructions (Signed)
If you are age 81 or older, your body mass index should be between 23-30. Your Body mass index is 24 kg/m. If this is out of the aforementioned range listed, please consider follow up with your Primary Care Provider.  If you are age 24 or younger, your body mass index should be between 19-25. Your Body mass index is 24 kg/m. If this is out of the aformentioned range listed, please consider follow up with your Primary Care Provider.   The [facility] providers would like to encourage you to use [facility] to communicate with providers for non-urgent requests or questions.  Due to long hold times on the telephone, sending your provider a message by [facility] may be faster and more efficient way to get a response. Please allow 48 business hours for a response.  Please remember that this is for non-urgent requests/questions.  PROCEDURES: You have been scheduled for a colonoscopy. Please follow the written instructions given to you at your visit today. If you use inhalers (even only as needed), please bring them with you on the day of your procedure.  LABS:  Lab work has been ordered for you today. Our lab is located in the basement. Press "B" on the elevator. The lab is located at the first door on the left as you exit the elevator.  HEALTHCARE LAWS AND MY CHART RESULTS: Due to recent changes in healthcare laws, you may see the results of your imaging and laboratory studies on MyChart before your provider has had a chance to review them.   We understand that in some cases there may be results that are confusing or concerning to you. Not all laboratory results come back in the same time frame and the provider may be waiting for multiple results in order to interpret others.  Please give [place] 48 hours in order for your provider to thoroughly review all the results before contacting the office for clarification of your results.   Use Miralax daily.  It was great seeing you today! Thank you for entrusting  me with your care and choosing [facility].  Tye Savoy, NP

## 2021-08-19 ENCOUNTER — Other Ambulatory Visit (INDEPENDENT_AMBULATORY_CARE_PROVIDER_SITE_OTHER): Payer: PPO

## 2021-08-19 DIAGNOSIS — K5909 Other constipation: Secondary | ICD-10-CM | POA: Diagnosis not present

## 2021-08-19 DIAGNOSIS — R1031 Right lower quadrant pain: Secondary | ICD-10-CM

## 2021-08-19 DIAGNOSIS — Z86010 Personal history of colonic polyps: Secondary | ICD-10-CM

## 2021-08-19 LAB — CBC WITH DIFFERENTIAL/PLATELET
Basophils Absolute: 0.1 10*3/uL (ref 0.0–0.1)
Basophils Relative: 1 % (ref 0.0–3.0)
Eosinophils Absolute: 0.5 10*3/uL (ref 0.0–0.7)
Eosinophils Relative: 6.5 % — ABNORMAL HIGH (ref 0.0–5.0)
HCT: 42.1 % (ref 36.0–46.0)
Hemoglobin: 14.3 g/dL (ref 12.0–15.0)
Lymphocytes Relative: 39.3 % (ref 12.0–46.0)
Lymphs Abs: 3.2 10*3/uL (ref 0.7–4.0)
MCHC: 34 g/dL (ref 30.0–36.0)
MCV: 90.4 fl (ref 78.0–100.0)
Monocytes Absolute: 0.7 10*3/uL (ref 0.1–1.0)
Monocytes Relative: 8.3 % (ref 3.0–12.0)
Neutro Abs: 3.7 10*3/uL (ref 1.4–7.7)
Neutrophils Relative %: 44.9 % (ref 43.0–77.0)
Platelets: 250 10*3/uL (ref 150.0–400.0)
RBC: 4.65 Mil/uL (ref 3.87–5.11)
RDW: 13 % (ref 11.5–15.5)
WBC: 8.1 10*3/uL (ref 4.0–10.5)

## 2021-08-19 LAB — COMPREHENSIVE METABOLIC PANEL
ALT: 8 U/L (ref 0–35)
AST: 16 U/L (ref 0–37)
Albumin: 4.1 g/dL (ref 3.5–5.2)
Alkaline Phosphatase: 68 U/L (ref 39–117)
BUN: 10 mg/dL (ref 6–23)
CO2: 30 mEq/L (ref 19–32)
Calcium: 9.1 mg/dL (ref 8.4–10.5)
Chloride: 103 mEq/L (ref 96–112)
Creatinine, Ser: 0.86 mg/dL (ref 0.40–1.20)
GFR: 63.29 mL/min (ref >60.00)
Glucose, Bld: 95 mg/dL (ref 70–99)
Potassium: 3.9 mEq/L (ref 3.5–5.1)
Sodium: 140 mEq/L (ref 135–145)
Total Bilirubin: 0.5 mg/dL (ref 0.2–1.2)
Total Protein: 6.6 g/dL (ref 6.0–8.3)

## 2021-08-20 ENCOUNTER — Telehealth: Payer: Self-pay

## 2021-08-20 NOTE — Telephone Encounter (Signed)
Left message for patient to please call back. 

## 2021-08-20 NOTE — Telephone Encounter (Signed)
-----   Message from Willia Craze, NP sent at 08/20/2021  3:21 PM EDT ----- Eustaquio Maize, please let her know that labs look okay. We are in process of getting her CT scan from Urology. She is scheduled for colonoscopy for history of polyps and also having RLQ pain. Thanks  Melisssa, any luck in getting her CT scan from Urology? Thanks

## 2021-08-21 NOTE — Telephone Encounter (Signed)
Patient returned call. Best contact [phone]

## 2021-08-21 NOTE — Progress Notes (Signed)
I have request the CT. Still waiting

## 2021-08-23 NOTE — Progress Notes (Signed)
CT report received and given to Tye Savoy, NP

## 2021-10-03 ENCOUNTER — Ambulatory Visit: Payer: PPO | Admitting: Internal Medicine

## 2021-10-03 ENCOUNTER — Other Ambulatory Visit: Payer: Self-pay

## 2021-10-03 ENCOUNTER — Encounter: Payer: Self-pay | Admitting: Internal Medicine

## 2021-10-03 VITALS — BP 148/72 | HR 60 | Temp 98.4°F | Resp 18 | Ht 61.0 in | Wt 129.0 lb

## 2021-10-03 DIAGNOSIS — Z86010 Personal history of colonic polyps: Secondary | ICD-10-CM

## 2021-10-03 MED ORDER — SODIUM CHLORIDE 0.9 % IV SOLN: 500.0000 mL | Freq: Once | INTRAVENOUS | Status: DC

## 2021-10-03 NOTE — Op Note (Addendum)
[place] Patient Name: Anita [name] Procedure Date: 10/03/2021 11:40 AM MRN: [id number] Endoscopist: Gatha Mayer , MD Age: 80 Referring MD:  Date of Birth: 02/19/1941 Gender: Female Account #: [id number] Procedure:                Colonoscopy Indications:              Surveillance: Personal history of adenomatous                            polyps on last colonoscopy 3 years ago, Last                            colonoscopy: 2019 Medicines:                Propofol per Anesthesia, Monitored Anesthesia Care Procedure:                Pre-Anesthesia Assessment:                           - Prior to the procedure, a History and Physical                            was performed, and patient medications and                            allergies were reviewed. The patient's tolerance of                            previous anesthesia was also reviewed. The risks                            and benefits of the procedure and the sedation                            options and risks were discussed with the patient.                            All questions were answered, and informed consent                            was obtained. Prior Anticoagulants: The patient has                            taken no previous anticoagulant or antiplatelet                            agents. ASA Grade Assessment: II - A patient with                            mild systemic disease. After reviewing the risks                            and benefits, the patient was deemed in  satisfactory condition to undergo the procedure.                           After obtaining informed consent, the colonoscope                            was passed under direct vision. Throughout the                            procedure, the patient's blood pressure, pulse, and                            oxygen saturations were monitored continuously. The                            Olympus PCF-H190DL  (#[id number]) Colonoscope was                            introduced through the anus and advanced to the the                            cecum, identified by appendiceal orifice and                            ileocecal valve. The colonoscopy was performed                            without difficulty. The patient tolerated the                            procedure well. The quality of the bowel                            preparation was good. The ileocecal valve,                            appendiceal orifice, and rectum were photographed.                            The bowel preparation used was Miralax via split                            dose instruction. Scope In: 11:45:16 AM Scope Out: 11:56:33 AM Scope Withdrawal Time: 0 hours 7 minutes 12 seconds  Total Procedure Duration: 0 hours 11 minutes 17 seconds  Findings:                 The digital rectal exam findings include anal                            stricture.                           Multiple diverticula were found in the sigmoid  colon.                           The exam was otherwise without abnormality on                            direct and retroflexion views. Complications:            No immediate complications. Estimated Blood Loss:     Estimated blood loss: none. Impression:               - Anal stricture found on digital rectal exam.                           - Diverticulosis in the sigmoid colon.                           - The examination was otherwise normal on direct                            and retroflexion views.                           - No specimens collected. Recommendation:           - Patient has a contact number available for                            emergencies. The signs and symptoms of potential                            delayed complications were discussed with the                            patient. Return to normal activities tomorrow.                            Written  discharge instructions were provided to the                            patient.                           - Resume previous diet.                           - Continue present medications.                           - No repeat colonoscopy due to age and the absence                            of colonic polyps.                           - she has been having post-prandial bloating and  nausea - w/u so far unrevealing - no cause seen                            here - will consider EGD - discussed in recovery -                            will schedule EGD to evaluate Gatha Mayer, MD 10/03/2021 12:03:45 PM This report has been signed electronically.

## 2021-10-03 NOTE — Progress Notes (Signed)
Sedate, gd SR, tolerated procedure well, VSS, report to RN 

## 2021-10-03 NOTE — Progress Notes (Addendum)
After sitting pt up to get dressed pt c/o of pain in her chest.  She currently is passing gas.  Notified MD Carlean Purl, provider at bedside.  New set of vitals obtained VS stable.

## 2021-10-03 NOTE — Progress Notes (Signed)
Medical history reviewed.  VS by DT

## 2021-10-03 NOTE — Patient Instructions (Addendum)
No polyps today. You do have diverticulosis - thickened muscle rings and pouches in the colon wall. Please read the handout about this condition.  The anus remains narrowed as in past.  I did not see anything that I think would be related to your symptoms.  I appreciate the opportunity to care for you. Gatha Mayer, MD, FACG   YOU HAD AN ENDOSCOPIC PROCEDURE TODAY AT [facility]:   Refer to the procedure report that was given to you for any specific questions about what was found during the examination.  If the procedure report does not answer your questions, please call your gastroenterologist to clarify.  If you requested that your care partner not be given the details of your procedure findings, then the procedure report has been included in a sealed envelope for you to review at your convenience later.  YOU SHOULD EXPECT: Some feelings of bloating in the abdomen. Passage of more gas than usual.  Walking can help get rid of the air that was put into your GI tract during the procedure and reduce the bloating. If you had a lower endoscopy (such as a colonoscopy or flexible sigmoidoscopy) you may notice spotting of blood in your stool or on the toilet paper. If you underwent a bowel prep for your procedure, you may not have a normal bowel movement for a few days.  Please Note:  You might notice some irritation and congestion in your nose or some drainage.  This is from the oxygen used during your procedure.  There is no need for concern and it should clear up in a day or so.  SYMPTOMS TO REPORT IMMEDIATELY:  Following lower endoscopy (colonoscopy or flexible sigmoidoscopy):  Excessive amounts of blood in the stool  Significant tenderness or worsening of abdominal pains  Swelling of the abdomen that is new, acute  Fever of 100F or higher  For urgent or emergent issues, a gastroenterologist can be reached at any hour by calling [phone]. Do not use MyChart messaging  for urgent concerns.    DIET:  We do recommend a small meal at first, but then you may proceed to your regular diet.  Drink plenty of fluids but you should avoid alcoholic beverages for 24 hours.  ACTIVITY:  You should plan to take it easy for the rest of today and you should NOT DRIVE or use heavy machinery until tomorrow (because of the sedation medicines used during the test).    FOLLOW UP: Our staff will call the number listed on your records 48-72 hours following your procedure to check on you and address any questions or concerns that you may have regarding the information given to you following your procedure. If we do not reach you, we will leave a message.  We will attempt to reach you two times.  During this call, we will ask if you have developed any symptoms of COVID 19. If you develop any symptoms (ie: fever, flu-like symptoms, shortness of breath, cough etc.) before then, please call [phone].  If you test positive for Covid 19 in the 2 weeks post procedure, please call and report this information to [place].    If any biopsies were taken you will be contacted by phone or by letter within the next 1-3 weeks.  Please call us at [phone] if you have not heard about the biopsies in 3 weeks.    SIGNATURES/CONFIDENTIALITY: You and/or your care partner have signed paperwork which will be entered into  your electronic medical record.  These signatures attest to the fact that that the information above on your After Visit Summary has been reviewed and is understood.  Full responsibility of the confidentiality of this discharge information lies with you and/or your care-partner.

## 2021-10-03 NOTE — Progress Notes (Signed)
[facility] History and Physical   Primary Care Physician:  Delilah Shan, MD   Reason for Procedure:   Hx colon polyp  Plan:    colonoscopy     HPI: Anita [name] is a 80 y.o. female s/p removal of 18 mm TV adenoma in 2019 and w/ hx post-prandial nausea and bloating - see 10/22 NP note also  Wt Readings from Last 3 Encounters:  10/03/21 129 lb (58.5 kg)  08/16/21 129 lb 2 oz (58.6 kg)  09/20/18 117 lb 15.1 oz (53.5 kg)     Past Medical History:  Diagnosis Date   Allergy    seasonal   Cough    Diverticulitis    Esophageal candidiasis ([place]) - treated 03/13/2016   Esophageal stricture    GERD (gastroesophageal reflux disease)    Hx of adenomatous polyp of colon 01/09/2018   IBS (irritable bowel syndrome)    Kidney stones    right kidney   Thyroid disease     Past Surgical History:  Procedure Laterality Date   COLONOSCOPY     ESOPHAGEAL DILATION     FACIAL RECONSTRUCTION SURGERY     HYSTEROTOMY     lipoma right shoulder removal     THYROIDECTOMY, PARTIAL     TUBAL LIGATION     UPPER GASTROINTESTINAL ENDOSCOPY      Prior to Admission medications   Medication Sig Start Date End Date Taking? Authorizing Provider  aspirin EC 325 MG tablet Take 325 mg by mouth as needed (pain/headache).    Yes [provider]  omeprazole (PRILOSEC) 40 MG capsule Take 40 mg by mouth See admin instructions. Open capsule and sprinkle over applesauce once daily 10/06/16  Yes [provider]  SYNTHROID 75 MCG tablet Take 75 mcg by mouth every morning. 08/01/21  Yes [provider]  fluticasone (FLONASE) 50 MCG/ACT nasal spray Place 1 spray into both nostrils as needed for allergies or rhinitis.    [provider]  levalbuterol Penne Lash HFA) 45 MCG/ACT inhaler Inhale 1-2 puffs into the lungs every 4 (four) hours as needed for wheezing. 01/13/17   Lysbeth Penner, FNP    Current Outpatient Medications  Medication Sig Dispense Refill    aspirin EC 325 MG tablet Take 325 mg by mouth as needed (pain/headache).      omeprazole (PRILOSEC) 40 MG capsule Take 40 mg by mouth See admin instructions. Open capsule and sprinkle over applesauce once daily     SYNTHROID 75 MCG tablet Take 75 mcg by mouth every morning.     fluticasone (FLONASE) 50 MCG/ACT nasal spray Place 1 spray into both nostrils as needed for allergies or rhinitis.     levalbuterol (XOPENEX HFA) 45 MCG/ACT inhaler Inhale 1-2 puffs into the lungs every 4 (four) hours as needed for wheezing. 1 Inhaler 1   Current Facility-Administered Medications  Medication Dose Route Frequency Provider Last Rate Last Admin   0.9 %  sodium chloride infusion  500 mL Intravenous Once Gatha Mayer, MD        Allergies as of 10/03/2021 - Review Complete 10/03/2021  Allergen Reaction Noted   Clindamycin Nausea Only and Nausea And Vomiting 11/23/2017   Codeine Anaphylaxis 03/04/2013   Minocin [minocycline] Anaphylaxis 03/04/2013   Peanut-containing drug products Anaphylaxis 03/04/2013   Penicillins Anaphylaxis 03/04/2013   Sulfa antibiotics Anaphylaxis 03/04/2013   Sulfacetamide sodium Anaphylaxis 08/28/2015   Iohexol Other (See Comments) 03/19/2006   Latex Swelling 03/04/2013   Pseudoephedrine-naproxen na er Other (See  Comments) 08/28/2015    Family History  Problem Relation Age of Onset   Heart disease Mother    Diabetes Mother    Kidney disease Father    Breast cancer Sister    Breast cancer Maternal Aunt    Liver cancer Maternal Aunt    Diabetes Maternal Aunt    Liver cancer Paternal Uncle    Breast cancer Maternal Grandmother    Diabetes Maternal Grandmother    Colon cancer Neg Hx    Stomach cancer Neg Hx    Esophageal cancer Neg Hx    Pancreatic cancer Neg Hx    Rectal cancer Neg Hx     Social History   Socioeconomic History   Marital status: Widowed    Spouse name: Not on file   Number of children: 4   Years of education: Not on file   Highest education  level: Not on file  Occupational History   Occupation: Secondary school teacher  Tobacco Use   Smoking status: Former    Packs/day: 1.00    Years: 15.00    Pack years: 15.00    Types: Cigarettes    Quit date: 10/21/1975    Years since quitting: 45.9   Smokeless tobacco: Never  Vaping Use   Vaping Use: Never used  Substance and Sexual Activity   Alcohol use: Yes    Alcohol/week: 2.0 standard drinks    Types: 2 Glasses of wine per week    Comment: 1-2 glasses of wine a week   Drug use: No   Sexual activity: Yes    Review of Systems:  other review of systems negative except as mentioned in the HPI.  Physical Exam: Vital signs BP 120/60    Pulse 71    Temp 98.4 F (36.9 C)    Ht 5\' 1"  (1.549 m)    Wt 129 lb (58.5 kg)    SpO2 98%    BMI 24.37 kg/m   General:   Alert,  Well-developed, well-nourished, pleasant and cooperative in NAD Lungs:  Clear throughout to auscultation.   Heart:  Regular rate and rhythm; no murmurs, clicks, rubs,  or gallops. Abdomen:  Soft, nontender and nondistended. Normal bowel sounds.   Neuro/Psych:  Alert and cooperative. Normal mood and affect. A and O x 3   @Shahad Mazurek  Simonne Maffucci, MD, [facility] [phone] (pager) 10/03/2021 11:34 AM@

## 2021-10-07 ENCOUNTER — Telehealth: Payer: Self-pay

## 2021-10-07 NOTE — Telephone Encounter (Signed)
°  Follow up Call-  Call back number 10/03/2021  Post procedure Call Back phone  # [phone]  Permission to leave phone message Yes  Some recent data might be hidden     Patient questions:  Do you have a fever, pain , or abdominal swelling? Yes.   Pain Score  3 *  Have you tolerated food without any problems? Yes.    Have you been able to return to your normal activities? Yes.    Do you have any questions about your discharge instructions: Diet   No. Medications  No. Follow up visit  No.  Do you have questions or concerns about your Care? No.  Actions: * If pain score is 4 or above: No action needed, pain <4.  Pt states abdominal bloating same as prior to procedure.   Have you developed a fever since your procedure? no  2.   Have you had an respiratory symptoms (SOB or cough) since your procedure? no  3.   Have you tested positive for COVID 19 since your procedure no  4.   Have you had any family members/close contacts diagnosed with the COVID 19 since your procedure?  n0   If yes to any of these questions please route to Joylene John, RN and Joella Prince, RN

## 2021-10-18 ENCOUNTER — Encounter (HOSPITAL_COMMUNITY): Payer: Self-pay | Admitting: Emergency Medicine

## 2021-10-18 ENCOUNTER — Emergency Department (HOSPITAL_COMMUNITY): Payer: PPO

## 2021-10-18 ENCOUNTER — Ambulatory Visit (AMBULATORY_SURGERY_CENTER): Payer: PPO | Admitting: *Deleted

## 2021-10-18 ENCOUNTER — Emergency Department (HOSPITAL_COMMUNITY)
Admission: EM | Admit: 2021-10-18 | Discharge: 2021-10-18 | Disposition: A | Discharge status: Home / Self Care | Payer: PPO | Attending: Emergency Medicine | Admitting: Emergency Medicine

## 2021-10-18 ENCOUNTER — Other Ambulatory Visit: Payer: Self-pay

## 2021-10-18 VITALS — Ht 61.5 in | Wt 120.0 lb

## 2021-10-18 DIAGNOSIS — Z91010 Allergy to peanuts: Secondary | ICD-10-CM | POA: Diagnosis not present

## 2021-10-18 DIAGNOSIS — S0990XA Unspecified injury of head, initial encounter: Secondary | ICD-10-CM | POA: Diagnosis not present

## 2021-10-18 DIAGNOSIS — W19XXXA Unspecified fall, initial encounter: Secondary | ICD-10-CM

## 2021-10-18 DIAGNOSIS — Z87891 Personal history of nicotine dependence: Secondary | ICD-10-CM | POA: Diagnosis not present

## 2021-10-18 DIAGNOSIS — Z91040 Latex allergy status: Secondary | ICD-10-CM | POA: Diagnosis not present

## 2021-10-18 DIAGNOSIS — R110 Nausea: Secondary | ICD-10-CM

## 2021-10-18 DIAGNOSIS — S60221A Contusion of right hand, initial encounter: Secondary | ICD-10-CM | POA: Diagnosis not present

## 2021-10-18 DIAGNOSIS — S80811A Abrasion, right lower leg, initial encounter: Secondary | ICD-10-CM | POA: Insufficient documentation

## 2021-10-18 DIAGNOSIS — S6991XA Unspecified injury of right wrist, hand and finger(s), initial encounter: Secondary | ICD-10-CM | POA: Diagnosis present

## 2021-10-18 DIAGNOSIS — T07XXXA Unspecified multiple injuries, initial encounter: Secondary | ICD-10-CM

## 2021-10-18 DIAGNOSIS — Z7982 Long term (current) use of aspirin: Secondary | ICD-10-CM | POA: Diagnosis not present

## 2021-10-18 DIAGNOSIS — S8011XA Contusion of right lower leg, initial encounter: Secondary | ICD-10-CM | POA: Diagnosis not present

## 2021-10-18 DIAGNOSIS — W108XXA Fall (on) (from) other stairs and steps, initial encounter: Secondary | ICD-10-CM | POA: Diagnosis not present

## 2021-10-18 DIAGNOSIS — M25512 Pain in left shoulder: Secondary | ICD-10-CM | POA: Insufficient documentation

## 2021-10-18 MED ORDER — TRAMADOL HCL 50 MG PO TABS: 50.0000 mg | ORAL_TABLET | Freq: Four times a day (QID) | ORAL | 0 refills | Status: AC | PRN

## 2021-10-18 MED ORDER — TRAMADOL HCL 50 MG PO TABS
50.0000 mg | ORAL_TABLET | Freq: Once | ORAL | Status: AC
  Administered 2021-10-18: 21:00:00 50 mg via ORAL
  Filled 2021-10-18: qty 1

## 2021-10-18 NOTE — ED Notes (Signed)
Pt leg dressed by dr Eulis Foster, paper bottoms provided to pt.

## 2021-10-18 NOTE — Progress Notes (Signed)
No egg or soy allergy known to patient  No issues known to pt with past sedation with any surgeries or procedures Patient denies ever being told they had issues or difficulty with intubation  No FH of Malignant Hyperthermia Pt is not on diet pills Pt is not on  home 02  Pt is not on blood thinners  Pt denies issues with constipation  No A fib or A flutter  Pt is NOT vaccinated  for Covid - pt had covid 2020 and had blood clots with this per pt   Due to the COVID-19 pandemic we are asking patients to follow certain guidelines in PV and the [place]   Pt aware of COVID protocols and [facility] guidelines   PV completed over the phone. Pt verified name, DOB, address and insurance during PV today.  Pt mailed instruction packet with copy of consent form to read and not return, and instructions.   Pt encouraged to call with questions or issues.  If pt has My chart, procedure instructions sent via My Chart   Pt states she has some shortness of breath and congestion- she states she is going to her PCP in the next few days and will notify us if we need to know any new information

## 2021-10-18 NOTE — ED Triage Notes (Signed)
Patient here from home reporting fall down 7-8 stairs hitting left back of head, right leg injury bleeding controlled and nausea. States that she does not have a lot of memory of incident.

## 2021-10-18 NOTE — ED Provider Notes (Signed)
Emergency Medicine Provider Triage Evaluation Note  Anita [name] , a 80 y.o. female  was evaluated in triage.  Pt complains of fall.  She states that at about 1215 she was at work when she was walking down some stairs and fell.  She states that she fell about 7-8 stairs hitting the back/side of her left head.  She reports loss of consciousness.  She states that when she woke up she was confused and has had intermittent nausea.  She is also having pain to the right lower leg.  She denies shortness of breath, chest pain or lightheadedness just prior to the fall.  Not anticoagulated.  Review of Systems  Positive: See above Negative:  Physical Exam  BP (!) 158/85 (BP Location: Left Arm)    Pulse 78    Temp 98 F (36.7 C) (Oral)    Resp 20    SpO2 98%  Gen:   Awake, no distress, in cervical collar Resp:  Normal effort  MSK:   Moves extremities without difficulty  Other:  Right lower extremity with large skin tear on the shin and surrounding bruising.  She has scattered bruising on her hands.  There is swelling to the left side of her head.  No focal neurological deficits.  No C-spine tenderness.  No T or L-spine tenderness to palpation.  No chest wall tenderness or abdominal tenderness.  Medical Decision Making  Medically screening exam initiated at 1:03 PM.  Appropriate orders placed.  Chizuko F Housholder was informed that the remainder of the evaluation will be completed by another provider, this initial triage assessment does not replace that evaluation, and the importance of remaining in the ED until their evaluation is complete.     Mickie Hillier, PA-C 10/18/21 1305    Valarie Merino, MD 10/18/21 [phone]

## 2021-10-18 NOTE — Discharge Instructions (Signed)
Use ice on sore spots 3-4 times a day for 2 days.  After that use heat.  Remove the dressing from the right lower leg, in 3 days and start cleaning it daily with soap and water.  Keep a light bandage on the wound until it heals.

## 2021-10-18 NOTE — ED Provider Notes (Signed)
[place] DEPT Provider Note   CSN: [id number] Arrival date & time: 10/18/21  1229     History Chief Complaint  Patient presents with   Fall   Leg Injury   Nausea    Anita [name] is a 80 y.o. female.  HPI She presents for evaluation of injuries that occurred as she was walking down some steps, and fell down several of them.  Coworkers told her that she struck her head at the bottom of the steps on concrete and that she lost consciousness.  A coworker brought her to the ED by private vehicle.  She complains of pain in her head, left shoulder, right hand, and right shin.  She denies any prodrome before the accident and states that she was able to walk afterwards.    Past Medical History:  Diagnosis Date   Allergy    seasonal   Cough    Diverticulitis    Emphysema of lung (HCC)    mild   Esophageal candidiasis ([place]) - treated 03/13/2016   Esophageal stricture    GERD (gastroesophageal reflux disease)    Hx of adenomatous polyp of colon 01/09/2018   IBS (irritable bowel syndrome)    Kidney stones    right kidney   Thyroid disease    some thyroid removed    Patient Active Problem List   Diagnosis Date Noted   Hx of adenomatous polyp of colon 01/09/2018   Bloating 10/29/2017   Other dysphagia 05/13/2017   Positive D dimer    Precordial chest pain    Chest pain 05/02/2017   Shortness of breath 05/02/2017   Cough 05/02/2017   Emphysema of lung ([place]) 05/02/2017   Esophageal candidiasis ([place]) - treated 03/13/2016   Dysphagia, pharyngoesophageal phase 09/20/2015   Upper airway cough syndrome 09/20/2015    Past Surgical History:  Procedure Laterality Date   COLONOSCOPY     ESOPHAGEAL DILATION     FACIAL RECONSTRUCTION SURGERY     growth removed left and right side and jaw locked up   HYSTEROTOMY     has ovaries and tubes   lipoma right shoulder removal     THYROIDECTOMY, PARTIAL     TUBAL LIGATION     UPPER GASTROINTESTINAL  ENDOSCOPY       OB History   No obstetric history on file.     Family History  Problem Relation Age of Onset   Heart disease Mother    Diabetes Mother    Colon polyps Father    Kidney disease Father    Colon polyps Sister    Breast cancer Sister    Breast cancer Maternal Aunt    Liver cancer Maternal Aunt    Diabetes Maternal Aunt    Liver cancer Paternal Uncle    Breast cancer Maternal Grandmother    Diabetes Maternal Grandmother    Colon cancer Neg Hx    Stomach cancer Neg Hx    Esophageal cancer Neg Hx    Pancreatic cancer Neg Hx    Rectal cancer Neg Hx     Social History   Tobacco Use   Smoking status: Former    Packs/day: 1.00    Years: 15.00    Pack years: 15.00    Types: Cigarettes    Quit date: 10/21/1975    Years since quitting: 46.0   Smokeless tobacco: Never  Vaping Use   Vaping Use: Never used  Substance Use Topics   Alcohol use: Yes  Alcohol/week: 2.0 standard drinks    Types: 2 Glasses of wine per week    Comment: 1-2 glasses of wine a week   Drug use: No    Home Medications Prior to Admission medications   Medication Sig Start Date End Date Taking? Authorizing Provider  traMADol (ULTRAM) 50 MG tablet Take 1 tablet (50 mg total) by mouth every 6 (six) hours as needed for moderate pain or severe pain. 10/18/21  Yes Daleen Bo, MD  aspirin EC 325 MG tablet Take 325 mg by mouth as needed (pain/headache).     [provider]  fluticasone (FLONASE) 50 MCG/ACT nasal spray Place 1 spray into both nostrils as needed for allergies or rhinitis.    [provider]  guaiFENesin (MUCINEX) 600 MG 12 hr tablet Take by mouth 2 (two) times daily.    [provider]  levalbuterol Penne Lash HFA) 45 MCG/ACT inhaler Inhale 1-2 puffs into the lungs every 4 (four) hours as needed for wheezing. 01/13/17   Lysbeth Penner, FNP  omeprazole (PRILOSEC) 40 MG capsule Take 40 mg by mouth See admin instructions. Open capsule and sprinkle over  applesauce once daily 10/06/16   [provider]  SYNTHROID 75 MCG tablet Take 75 mcg by mouth every morning. 08/01/21   [provider]    Allergies    Clindamycin, Codeine, Minocin [minocycline], Peanut-containing drug products, Penicillins, Sulfa antibiotics, Sulfacetamide sodium, Iohexol, Latex, and Pseudoephedrine-naproxen na er  Review of Systems   Review of Systems  All other systems reviewed and are negative.  Physical Exam Updated Vital Signs BP 139/72 (BP Location: Left Arm)    Pulse 70    Temp 98.1 F (36.7 C) (Oral)    Resp 18    SpO2 98%   Physical Exam Vitals and nursing note reviewed.  Constitutional:      General: She is not in acute distress.    Appearance: She is well-developed. She is not ill-appearing or diaphoretic.  HENT:     Head: Normocephalic.     Comments: Right parietal contusion, without abrasion or laceration    Right Ear: External ear normal.     Left Ear: External ear normal.  Eyes:     Conjunctiva/sclera: Conjunctivae normal.     Pupils: Pupils are equal, round, and reactive to light.  Neck:     Trachea: Phonation normal.  Cardiovascular:     Rate and Rhythm: Normal rate and regular rhythm.     Heart sounds: Normal heart sounds.  Pulmonary:     Effort: Pulmonary effort is normal.     Breath sounds: Normal breath sounds.  Chest:     Chest wall: No tenderness (No crepitation or deformity).  Abdominal:     General: There is no distension.     Palpations: Abdomen is soft.     Tenderness: There is no abdominal tenderness.  Musculoskeletal:        General: Normal range of motion.     Cervical back: Normal range of motion and neck supple.     Comments: She guards against moving the left shoulder, somewhat but has fair range of motion of it.  There is mild tenderness to the left shoulder without palpable deformity.  There is no deformity of the clavicles.  She has a contusion and abrasion of her right shin, there is bleeding  somewhat.  There is a contusion of the right first metacarpal region but there is no range of motion of the right hand.  No other  large or small joint injuries appreciated.  Skin:    General: Skin is warm and dry.  Neurological:     Mental Status: She is alert and oriented to person, place, and time.     Cranial Nerves: No cranial nerve deficit.     Sensory: No sensory deficit.     Motor: No abnormal muscle tone.     Coordination: Coordination normal.     Comments: No dysarthria or aphasia  Psychiatric:        Mood and Affect: Mood normal.        Behavior: Behavior normal.        Thought Content: Thought content normal.        Judgment: Judgment normal.    ED Results / Procedures / Treatments   Labs (all labs ordered are listed, but only abnormal results are displayed) Labs Reviewed - No data to display  EKG None  Radiology DG Tibia/Fibula Right  Result Date: 10/18/2021 CLINICAL DATA:  Trauma, fall EXAM: RIGHT TIBIA AND FIBULA - 2 VIEW COMPARISON:  None. FINDINGS: No displaced fracture or dislocation is seen. IMPRESSION: No recent fracture is seen in the right tibia and fibula. Electronically Signed   By: Elmer Picker M.D.   On: 10/18/2021 13:35   CT Head Wo Contrast  Result Date: 10/18/2021 CLINICAL DATA:  Fall down stairs. EXAM: CT HEAD WITHOUT CONTRAST CT CERVICAL SPINE WITHOUT CONTRAST TECHNIQUE: Multidetector CT imaging of the head and cervical spine was performed following the standard protocol without intravenous contrast. Multiplanar CT image reconstructions of the cervical spine were also generated. COMPARISON:  None. FINDINGS: CT HEAD FINDINGS Brain: No evidence of acute infarction, hemorrhage, hydrocephalus, extra-axial collection or mass lesion/mass effect. Vascular: No hyperdense vessel or unexpected calcification. Skull: Normal. Negative for fracture or focal lesion. Sinuses/Orbits: Pansinusitis is noted. Other: Small left parietal scalp hematoma is noted. CT  CERVICAL SPINE FINDINGS Alignment: Minimal grade 1 anterolisthesis of C3-4 and C4-5 is noted secondary to posterior facet joint hypertrophy. Skull base and vertebrae: No acute fracture. No primary bone lesion or focal pathologic process. Soft tissues and spinal canal: No prevertebral fluid or swelling. No visible canal hematoma. Disc levels: Moderate degenerative disc disease is noted at C4-5 and C5-6. Upper chest: Negative. Other: None. IMPRESSION: Small left parietal scalp hematoma. No acute intracranial abnormality seen. Pansinusitis. Moderate multilevel degenerative disc disease. No acute abnormality seen in the cervical spine. Electronically Signed   By: Marijo Conception M.D.   On: 10/18/2021 14:00   CT Cervical Spine Wo Contrast  Result Date: 10/18/2021 CLINICAL DATA:  Fall down stairs. EXAM: CT HEAD WITHOUT CONTRAST CT CERVICAL SPINE WITHOUT CONTRAST TECHNIQUE: Multidetector CT imaging of the head and cervical spine was performed following the standard protocol without intravenous contrast. Multiplanar CT image reconstructions of the cervical spine were also generated. COMPARISON:  None. FINDINGS: CT HEAD FINDINGS Brain: No evidence of acute infarction, hemorrhage, hydrocephalus, extra-axial collection or mass lesion/mass effect. Vascular: No hyperdense vessel or unexpected calcification. Skull: Normal. Negative for fracture or focal lesion. Sinuses/Orbits: Pansinusitis is noted. Other: Small left parietal scalp hematoma is noted. CT CERVICAL SPINE FINDINGS Alignment: Minimal grade 1 anterolisthesis of C3-4 and C4-5 is noted secondary to posterior facet joint hypertrophy. Skull base and vertebrae: No acute fracture. No primary bone lesion or focal pathologic process. Soft tissues and spinal canal: No prevertebral fluid or swelling. No visible canal hematoma. Disc levels: Moderate degenerative disc disease is noted at C4-5 and C5-6. Upper chest: Negative. Other: None.  IMPRESSION: Small left parietal scalp  hematoma. No acute intracranial abnormality seen. Pansinusitis. Moderate multilevel degenerative disc disease. No acute abnormality seen in the cervical spine. Electronically Signed   By: Marijo Conception M.D.   On: 10/18/2021 14:00   DG Shoulder Left  Result Date: 10/18/2021 CLINICAL DATA:  Status post fall. EXAM: LEFT SHOULDER - 2+ VIEW COMPARISON:  None. FINDINGS: There is no evidence of an acute fracture or dislocation. A chronic fracture deformity is seen involving the distal left clavicle. There is mild bowing of the visualized portion of the left humeral shaft. Mild degenerative changes are seen along the left acromioclavicular joint. Mild lateral soft tissue swelling is noted. IMPRESSION: 1. No acute fracture or dislocation. 2. Chronic fracture deformity of the distal left clavicle. Electronically Signed   By: Virgina Norfolk M.D.   On: 10/18/2021 18:59    Procedures Procedures   Medications Ordered in ED Medications  traMADol (ULTRAM) tablet 50 mg (50 mg Oral Given 10/18/21 2102)    ED Course  I have reviewed the triage vital signs and the nursing notes.  Pertinent labs & imaging results that were available during my care of the patient were reviewed by me and considered in my medical decision making (see chart for details).  Clinical Course as of 10/19/21 1013  Fri Oct 18, 2021  2115 Wound care right shin by me.  Wound cleansed with saline using gauze scrub.  There is a large skin splitting abrasion of the epithelium, which exposes the intact dermis.  The wound is bleeding somewhat and measures approximately 4 x 12 cm.  After cleansing and drying the wound I applied a occlusive dressing, followed by gauze and [place].  Verbal wound care instructions given to the patient and her companion at the time of dressing application. [EW]    Clinical Course User Index [EW] Daleen Bo, MD   MDM Rules/Calculators/A&P                          Patient Vitals for the past 24 hrs:  BP  Temp Temp src Pulse Resp SpO2  10/18/21 2137 139/72 98.1 F (36.7 C) Oral 70 -- 98 %  10/18/21 1900 133/68 -- -- 72 18 96 %  10/18/21 1845 140/74 -- -- 72 18 96 %  10/18/21 1830 140/76 -- -- 66 -- 97 %  10/18/21 1800 (!) 149/78 -- -- 70 -- 97 %  10/18/21 1742 (!) 164/76 -- -- 72 18 97 %  10/18/21 1423 131/82 -- -- 82 18 97 %  10/18/21 1236 (!) 158/85 98 F (36.7 C) Oral 78 20 98 %    At the time of disposition- reevaluation with update and discussion with patient and a female companion. After initial assessment and treatment, an updated evaluation reveals she is comfortable has no further complaints. Illness risk, progressive symptoms after traumatic injury, discussed. Daleen Bo    Medical Decision Making: Summary: Elderly female fell going down steps, mechanism not clear.  She reports loss of consciousness but there is no one available to confirm this.  She has remained stable during the ED evaluation and not indicating any central nervous system or cardiovascular instability.  She required imaging, and wound care prior to discharge.  Critical Interventions-radiography; to evaluate  Chief Complaint  Patient presents with   Fall   Leg Injury   Nausea    and assess for illness characterized as Acute, Previously Undiagnosed, and Uncertain Prognosis  After These Interventions, the Patient was reevaluated and was found improved, with the treatment administered  This patient is presenting for evaluation of fall and possible loss of consciousness, which does require a range of treatment options, and is a complaint that involves a moderate risk of morbidity and mortality. The differential diagnoses include head injury, cervical spine injury, long bone injury, soft tissue injury. I decided to review pertinent external data, and in summary there is no pertinent available data.  I did not require additional historical information from anyone, as the patient is lucid.  Radiologic Tests  Ordered, included CT head, CT cervical spine radiographs.  I independently Visualized: Radiographic images, which show no acute injuries, fractures or dislocations    Pharmaceutical Risk Management OTC medications   Treatment Complication Risk Requirement outpatient self supervised care    Nursing Notes Reviewed/ Care Coordinated Applicable Imaging Reviewed Interpretation of Laboratory Data incorporated into ED treatment  The patient appears reasonably screened and/or stabilized for discharge and I doubt any other medical condition or other [facility] requiring further screening, evaluation, or treatment in the ED at this time prior to discharge.  Plan: Home Medications-Tylenol for pain, continue routine medications; Home Treatments-wound care right shin beginning in 3 days to include cleansings and dressing changes until healed.  Cryotherapy for contusions; return here if the recommended treatment, does not improve the symptoms; Recommended follow up-PCP, as needed        Final Clinical Impression(s) / ED Diagnoses Final diagnoses:  Contusion, multiple sites  Injury of head, initial encounter  Abrasion of right lower leg, initial encounter  Fall, initial encounter    Rx / DC Orders ED Discharge Orders          Ordered    traMADol (ULTRAM) 50 MG tablet  Every 6 hours PRN        10/18/21 2130             Daleen Bo, MD 10/19/21 1016

## 2021-10-18 NOTE — ED Notes (Signed)
Dc instructions and scripts reviewed with pt. No questions or concerns at this time. Pt wheeled outside and significant other is transporting pt home.

## 2021-11-12 ENCOUNTER — Encounter: Payer: Self-pay | Admitting: Internal Medicine

## 2021-11-19 ENCOUNTER — Encounter: Payer: PPO | Admitting: Internal Medicine

## 2021-12-03 ENCOUNTER — Encounter (HOSPITAL_BASED_OUTPATIENT_CLINIC_OR_DEPARTMENT_OTHER): Payer: PPO | Admitting: Internal Medicine

## 2021-12-27 ENCOUNTER — Encounter: Payer: PPO | Admitting: Internal Medicine

## 2021-12-31 ENCOUNTER — Telehealth: Payer: Self-pay | Admitting: Internal Medicine

## 2021-12-31 NOTE — Telephone Encounter (Signed)
Pt stated that she has knots on her head (All over) that are sensitive to touch due to a fall that was back in December: Pt questioning if she should still precede with EGD. Pt states that she has a sinus infection but her main concern is the knots on her head:   ?Please advise  ?

## 2021-12-31 NOTE — Telephone Encounter (Signed)
Pt made aware of Dr. Carlean Purl recommendations: Pt stated that she would proceed: ?Pt verbalized understanding with all questions answered.  ? ?

## 2021-12-31 NOTE — Telephone Encounter (Signed)
Inbound call from patient states she have a sinus infection and is unsure if she should continue to go through with egd. States she is not having any trouble swallowing or food getting stuck. Suggest maybe an mri  ?

## 2021-12-31 NOTE — Telephone Encounter (Signed)
I think it should be ok to proceed but think this is elective and it is ultimately up to her ?

## 2021-12-31 NOTE — Telephone Encounter (Signed)
Left message for pt to call back  °

## 2022-01-02 ENCOUNTER — Encounter: Payer: Self-pay | Admitting: Internal Medicine

## 2022-01-02 ENCOUNTER — Ambulatory Visit (AMBULATORY_SURGERY_CENTER): Payer: PPO | Admitting: Internal Medicine

## 2022-01-02 ENCOUNTER — Other Ambulatory Visit: Payer: Self-pay | Admitting: Internal Medicine

## 2022-01-02 VITALS — BP 113/65 | HR 68 | Temp 98.7°F | Resp 13 | Ht 61.5 in | Wt 125.0 lb

## 2022-01-02 DIAGNOSIS — K222 Esophageal obstruction: Secondary | ICD-10-CM | POA: Diagnosis not present

## 2022-01-02 DIAGNOSIS — K317 Polyp of stomach and duodenum: Secondary | ICD-10-CM | POA: Diagnosis not present

## 2022-01-02 DIAGNOSIS — K2950 Unspecified chronic gastritis without bleeding: Secondary | ICD-10-CM | POA: Diagnosis not present

## 2022-01-02 DIAGNOSIS — R110 Nausea: Secondary | ICD-10-CM

## 2022-01-02 DIAGNOSIS — K31A0 Gastric intestinal metaplasia, unspecified: Secondary | ICD-10-CM | POA: Diagnosis not present

## 2022-01-02 DIAGNOSIS — K2970 Gastritis, unspecified, without bleeding: Secondary | ICD-10-CM | POA: Diagnosis not present

## 2022-01-02 MED ORDER — SODIUM CHLORIDE 0.9 % IV SOLN: 500.0000 mL | Freq: Once | INTRAVENOUS | Status: DC

## 2022-01-02 NOTE — Op Note (Signed)
[facility] ?Patient Name: Anita [name] ?Procedure Date: 01/02/2022 10:50 AM ?MRN: [id number] ?Endoscopist: Gatha Mayer , MD ?Age: 81 ?Referring MD:  ?Date of Birth: June 28, 1941 ?Gender: Female ?Account #: [id number] ?Procedure:                Upper GI endoscopy ?Indications:              Abdominal bloating, Nausea ?Medicines:                Propofol per Anesthesia, Monitored Anesthesia Care ?Procedure:                Pre-Anesthesia Assessment: ?                          - Prior to the procedure, a History and Physical  ?                          was performed, and patient medications and  ?                          allergies were reviewed. The patient's tolerance of  ?                          previous anesthesia was also reviewed. The risks  ?                          and benefits of the procedure and the sedation  ?                          options and risks were discussed with the patient.  ?                          All questions were answered, and informed consent  ?                          was obtained. Prior Anticoagulants: The patient has  ?                          taken no previous anticoagulant or antiplatelet  ?                          agents. ASA Grade Assessment: II - A patient with  ?                          mild systemic disease. After reviewing the risks  ?                          and benefits, the patient was deemed in  ?                          satisfactory condition to undergo the procedure. ?                          After obtaining informed consent, the endoscope was  ?  passed under direct vision. Throughout the  ?                          procedure, the patient's blood pressure, pulse, and  ?                          oxygen saturations were monitored continuously. The  ?                          Endoscope was introduced through the mouth, and  ?                          advanced to the second part of duodenum. The upper  ?                          GI  endoscopy was accomplished without difficulty.  ?                          The patient tolerated the procedure well. ?Scope In: ?Scope Out: ?Findings:                 Patchy moderately erythematous mucosa without  ?                          bleeding was found in the gastric antrum. Biopsies  ?                          were taken with a cold forceps for histology.  ?                          Verification of patient identification for the  ?                          specimen was done. Estimated blood loss was minimal. ?                          Multiple diminutive sessile polyps with no stigmata  ?                          of recent bleeding were found in the gastric body.  ?                          Biopsies were taken with a cold forceps for  ?                          histology. Verification of patient identification  ?                          for the specimen was done. Estimated blood loss was  ?                          minimal. ?  A non-obstructing Schatzki ring was found at the  ?                          gastroesophageal junction. ?                          The exam was otherwise without abnormality. ?                          The cardia and gastric fundus were normal on  ?                          retroflexion. ?Complications:            No immediate complications. Estimated blood loss:  ?                          Minimal. ?Estimated Blood Loss:     Estimated blood loss was minimal. ?Impression:               - Erythematous mucosa in the antrum. Biopsied. ?                          - Multiple gastric polyps. Biopsied. Look like  ?                          innocent fundic gland polyps ?                          - Non-obstructing Schatzki ring. ?                          - The examination was otherwise normal. ?Recommendation:           - Patient has a contact number available for  ?                          emergencies. The signs and symptoms of potential  ?                           delayed complications were discussed with the  ?                          patient. Return to normal activities tomorrow.  ?                          Written discharge instructions were provided to the  ?                          patient. ?                          - Resume previous diet. ?                          - Continue present medications. ?                          -  Await pathology results. ?Gatha Mayer, MD ?01/02/2022 11:03:21 AM ?This report has been signed electronically. ?

## 2022-01-02 NOTE — Progress Notes (Signed)
VS-CW  Pt's states no medical or surgical changes since previsit or office visit.  

## 2022-01-02 NOTE — Progress Notes (Signed)
Pt in recovery with monitors in place, VSS. Report given to receiving RN. Bite guard was placed with pt awake to ensure comfort. No dental or soft tissue damage noted. 

## 2022-01-02 NOTE — Progress Notes (Signed)
Called to room to assist during endoscopic procedure.  Patient ID and intended procedure confirmed with present staff. Received instructions for my participation in the procedure from the performing physician.  

## 2022-01-02 NOTE — Patient Instructions (Addendum)
I fund signs of possible inflammation (gastritis) in the stomach. Took biopsies. ? ?I also saw some benign-appearing and innocent polyps in stomach - biopsied. I think they are not a problem. ? ?I will let you know results and plans. ? ?I appreciate the opportunity to care for you. ?Gatha Mayer, MD, [facility] ? ? ?YOU HAD AN ENDOSCOPIC PROCEDURE TODAY AT [facility]:   Refer to the procedure report that was given to you for any specific questions about what was found during the examination.  If the procedure report does not answer your questions, please call your gastroenterologist to clarify.  If you requested that your care partner not be given the details of your procedure findings, then the procedure report has been included in a sealed envelope for you to review at your convenience later. ? ?YOU SHOULD EXPECT: Some feelings of bloating in the abdomen. Passage of more gas than usual.  Walking can help get rid of the air that was put into your GI tract during the procedure and reduce the bloating. If you had a lower endoscopy (such as a colonoscopy or flexible sigmoidoscopy) you may notice spotting of blood in your stool or on the toilet paper. If you underwent a bowel prep for your procedure, you may not have a normal bowel movement for a few days. ? ?Please Note:  You might notice some irritation and congestion in your nose or some drainage.  This is from the oxygen used during your procedure.  There is no need for concern and it should clear up in a day or so. ? ?SYMPTOMS TO REPORT IMMEDIATELY: ? ?Following upper endoscopy (EGD) ? Vomiting of blood or coffee ground material ? New chest pain or pain under the shoulder blades ? Painful or persistently difficult swallowing ? New shortness of breath ? Fever of 100?F or higher ? Black, tarry-looking stools ? ?For urgent or emergent issues, a gastroenterologist can be reached at any hour by calling [phone]. ?Do not use MyChart messaging for  urgent concerns.  ? ? ?DIET:  We do recommend a small meal at first, but then you may proceed to your regular diet.  Drink plenty of fluids but you should avoid alcoholic beverages for 24 hours. ? ?ACTIVITY:  You should plan to take it easy for the rest of today and you should NOT DRIVE or use heavy machinery until tomorrow (because of the sedation medicines used during the test).   ? ?FOLLOW UP: ?Our staff will call the number listed on your records 48-72 hours following your procedure to check on you and address any questions or concerns that you may have regarding the information given to you following your procedure. If we do not reach you, we will leave a message.  We will attempt to reach you two times.  During this call, we will ask if you have developed any symptoms of COVID 19. If you develop any symptoms (ie: fever, flu-like symptoms, shortness of breath, cough etc.) before then, please call [phone].  If you test positive for Covid 19 in the 2 weeks post procedure, please call and report this information to [place].   ? ?If any biopsies were taken you will be contacted by phone or by letter within the next 1-3 weeks.  Please call us at [phone] if you have not heard about the biopsies in 3 weeks.  ? ? ?SIGNATURES/CONFIDENTIALITY: ?You and/or your care partner have signed paperwork which will be entered into your  electronic medical record.  These signatures attest to the fact that that the information above on your After Visit Summary has been reviewed and is understood.  Full responsibility of the confidentiality of this discharge information lies with you and/or your care-partner.  ?

## 2022-01-02 NOTE — Progress Notes (Signed)
[facility] History and Physical ? ? ?Primary Care Physician:  Belva Bertin, [place], PA-C ? ? ?Reason for Procedure:   Nausea and post-prandial bloating ? ?Plan:    EGD ? ? ? ? ?HPI: Anita [name] is a 81 y.o. female here for evaluation of nausea and post-prandial bloating ? ? ?Past Medical History:  ?Diagnosis Date  ? Allergy   ? seasonal  ? Cough   ? Diverticulitis   ? Emphysema of lung ([place])   ? mild  ? Esophageal candidiasis ([place]) - treated 03/13/2016  ? Esophageal stricture   ? GERD (gastroesophageal reflux disease)   ? Hx of adenomatous polyp of colon 01/09/2018  ? IBS (irritable bowel syndrome)   ? Kidney stones   ? right kidney  ? Thyroid disease   ? some thyroid removed  ? ? ?Past Surgical History:  ?Procedure Laterality Date  ? COLONOSCOPY    ? ESOPHAGEAL DILATION    ? FACIAL RECONSTRUCTION SURGERY    ? growth removed left and right side and jaw locked up  ? HYSTEROTOMY    ? has ovaries and tubes  ? lipoma right shoulder removal    ? THYROIDECTOMY, PARTIAL    ? TUBAL LIGATION    ? UPPER GASTROINTESTINAL ENDOSCOPY    ? ? ?Prior to Admission medications   ?Medication Sig Start Date End Date Taking? Authorizing Provider  ?aspirin EC 325 MG tablet Take 325 mg by mouth as needed (pain/headache).    Yes [provider]  ?fluticasone (FLONASE) 50 MCG/ACT nasal spray Place 1 spray into both nostrils as needed for allergies or rhinitis.   Yes [provider]  ?guaiFENesin (MUCINEX) 600 MG 12 hr tablet Take by mouth 2 (two) times daily.   Yes [provider]  ?levalbuterol (XOPENEX HFA) 45 MCG/ACT inhaler Inhale 1-2 puffs into the lungs every 4 (four) hours as needed for wheezing. 01/13/17  Yes Lysbeth Penner, FNP  ?omeprazole (PRILOSEC) 40 MG capsule Take 40 mg by mouth See admin instructions. Open capsule and sprinkle over applesauce once daily 10/06/16  Yes [provider]  ?SYNTHROID 75 MCG tablet Take 75 mcg by mouth every morning. 08/01/21  Yes [provider]  ?traMADol (ULTRAM) 50 MG tablet Take 1 tablet (50 mg total) by mouth every 6 (six) hours as needed for moderate pain or severe pain. 10/18/21   Daleen Bo, MD  ? ? ?Current Outpatient Medications  ?Medication Sig Dispense Refill  ? aspirin EC 325 MG tablet Take 325 mg by mouth as needed (pain/headache).     ? fluticasone (FLONASE) 50 MCG/ACT nasal spray Place 1 spray into both nostrils as needed for allergies or rhinitis.    ? guaiFENesin (MUCINEX) 600 MG 12 hr tablet Take by mouth 2 (two) times daily.    ? levalbuterol (XOPENEX HFA) 45 MCG/ACT inhaler Inhale 1-2 puffs into the lungs every 4 (four) hours as needed for wheezing. 1 Inhaler 1  ? omeprazole (PRILOSEC) 40 MG capsule Take 40 mg by mouth See admin instructions. Open capsule and sprinkle over applesauce once daily    ? SYNTHROID 75 MCG tablet Take 75 mcg by mouth every morning.    ? traMADol (ULTRAM) 50 MG tablet Take 1 tablet (50 mg total) by mouth every 6 (six) hours as needed for moderate pain or severe pain. 20 tablet 0  ? ?Current Facility-Administered Medications  ?Medication Dose Route Frequency Provider Last Rate Last Admin  ? 0.9 %  sodium chloride infusion  500  mL Intravenous Once Gatha Mayer, MD      ? ? ?Allergies as of 01/02/2022 - Review Complete 01/02/2022  ?Allergen Reaction Noted  ? Clindamycin Nausea Only and Nausea And Vomiting 11/23/2017  ? Codeine Anaphylaxis 03/04/2013  ? Minocin [minocycline] Anaphylaxis 03/04/2013  ? Peanut-containing drug products Anaphylaxis 03/04/2013  ? Penicillins Anaphylaxis 03/04/2013  ? Sulfa antibiotics Anaphylaxis 03/04/2013  ? Sulfacetamide sodium Anaphylaxis 08/28/2015  ? Iohexol Other (See Comments) 03/19/2006  ? Latex Swelling 03/04/2013  ? Pseudoephedrine-naproxen na er Other (See Comments) 08/28/2015  ? ? ?Family History  ?Problem Relation Age of Onset  ? Heart disease Mother   ? Diabetes Mother   ? Colon polyps Father   ? Kidney disease Father   ? Colon polyps Sister   ?  Breast cancer Sister   ? Breast cancer Maternal Aunt   ? Liver cancer Maternal Aunt   ? Diabetes Maternal Aunt   ? Liver cancer Paternal Uncle   ? Breast cancer Maternal Grandmother   ? Diabetes Maternal Grandmother   ? Colon cancer Neg Hx   ? Stomach cancer Neg Hx   ? Esophageal cancer Neg Hx   ? Pancreatic cancer Neg Hx   ? Rectal cancer Neg Hx   ? ? ?Social History  ? ?Socioeconomic History  ? Marital status: Widowed  ?  Spouse name: Not on file  ? Number of children: 4  ? Years of education: Not on file  ? Highest education level: Not on file  ?Occupational History  ? Occupation: Secondary school teacher  ?Tobacco Use  ? Smoking status: Former  ?  Packs/day: 1.00  ?  Years: 15.00  ?  Pack years: 15.00  ?  Types: Cigarettes  ?  Quit date: 10/21/1975  ?  Years since quitting: 46.2  ? Smokeless tobacco: Never  ?Vaping Use  ? Vaping Use: Never used  ?Substance and Sexual Activity  ? Alcohol use: Yes  ?  Alcohol/week: 7.0 standard drinks  ?  Types: 7 Glasses of wine per week  ?  Comment: 7 glasses of wine a week  ? Drug use: No  ? Sexual activity: Yes  ?  Birth control/protection: Condom  ?Other Topics Concern  ? ? ? ?Review of Systems: ? ?All other review of systems negative except as mentioned in the HPI. ? ?Physical Exam: ?Vital signs ?BP (!) 143/90   Pulse 75   Temp 98.7 ?F (37.1 ?C)   Ht 5' 1.5" (1.562 m)   Wt 125 lb (56.7 kg)   SpO2 96%   BMI 23.24 kg/m?  ? ?General:   Alert,  Well-developed, well-nourished, pleasant and cooperative in NAD ?Lungs:  Clear throughout to auscultation.   ?Heart:  Regular rate and rhythm; no murmurs, clicks, rubs,  or gallops. ?Abdomen:  Soft, nontender and nondistended. Normal bowel sounds.   ?Neuro/Psych:  Alert and cooperative. Normal mood and affect. A and O x 3 ? ? ?'@Jeson Camacho'$  Simonne Maffucci, MD, [facility] ?[facility] ?[phone] (pager) ?01/02/2022 10:36 AM@ ? ?

## 2022-01-06 ENCOUNTER — Telehealth: Payer: Self-pay

## 2022-01-06 ENCOUNTER — Encounter: Payer: Self-pay | Admitting: Internal Medicine

## 2022-01-06 ENCOUNTER — Telehealth: Payer: Self-pay | Admitting: *Deleted

## 2022-01-06 NOTE — Telephone Encounter (Signed)
?  Follow up Call- ? ?Call back number 01/02/2022 10/03/2021  ?Post procedure Call Back phone  # [phone]  ?Permission to leave phone message Yes Yes  ?Some recent data might be hidden  ?  ? ?Patient questions: ? ?Phone just kept ringing. ?

## 2022-01-06 NOTE — Telephone Encounter (Signed)
?  Follow up Call- ? ?Call back number 01/02/2022 10/03/2021  ?Post procedure Call Back phone  # [phone]  ?Permission to leave phone message Yes Yes  ?Some recent data might be hidden  ?  ? ?First follow up call, unable to leave msg. ? ? ?

## 2022-03-31 ENCOUNTER — Emergency Department (HOSPITAL_BASED_OUTPATIENT_CLINIC_OR_DEPARTMENT_OTHER)
Admission: EM | Admit: 2022-03-31 | Discharge: 2022-03-31 | Disposition: A | Discharge status: Home / Self Care | Payer: PPO | Attending: Emergency Medicine | Admitting: Emergency Medicine

## 2022-03-31 ENCOUNTER — Emergency Department (HOSPITAL_BASED_OUTPATIENT_CLINIC_OR_DEPARTMENT_OTHER): Payer: PPO

## 2022-03-31 ENCOUNTER — Encounter (HOSPITAL_BASED_OUTPATIENT_CLINIC_OR_DEPARTMENT_OTHER): Payer: Self-pay | Admitting: Emergency Medicine

## 2022-03-31 DIAGNOSIS — R1030 Lower abdominal pain, unspecified: Secondary | ICD-10-CM | POA: Diagnosis present

## 2022-03-31 DIAGNOSIS — Z7982 Long term (current) use of aspirin: Secondary | ICD-10-CM | POA: Diagnosis not present

## 2022-03-31 DIAGNOSIS — N200 Calculus of kidney: Secondary | ICD-10-CM | POA: Insufficient documentation

## 2022-03-31 DIAGNOSIS — Z91010 Allergy to peanuts: Secondary | ICD-10-CM | POA: Diagnosis not present

## 2022-03-31 DIAGNOSIS — I700 Atherosclerosis of aorta: Secondary | ICD-10-CM | POA: Insufficient documentation

## 2022-03-31 DIAGNOSIS — R1084 Generalized abdominal pain: Secondary | ICD-10-CM

## 2022-03-31 DIAGNOSIS — Z91040 Latex allergy status: Secondary | ICD-10-CM | POA: Diagnosis not present

## 2022-03-31 LAB — URINALYSIS, ROUTINE W REFLEX MICROSCOPIC
Bilirubin Urine: NEGATIVE
Glucose, UA: NEGATIVE mg/dL
Hgb urine dipstick: NEGATIVE
Ketones, ur: NEGATIVE mg/dL
Leukocytes,Ua: NEGATIVE
Nitrite: NEGATIVE
Protein, ur: NEGATIVE mg/dL
Specific Gravity, Urine: 1.01 (ref 1.005–1.030)
pH: 6.5 (ref 5.0–8.0)

## 2022-03-31 LAB — COMPREHENSIVE METABOLIC PANEL
ALT: 10 U/L (ref 0–44)
AST: 17 U/L (ref 15–41)
Albumin: 4 g/dL (ref 3.5–5.0)
Alkaline Phosphatase: 81 U/L (ref 38–126)
Anion gap: 8 (ref 5–15)
BUN: 11 mg/dL (ref 8–23)
CO2: 26 mmol/L (ref 22–32)
Calcium: 8.9 mg/dL (ref 8.9–10.3)
Chloride: 103 mmol/L (ref 98–111)
Creatinine, Ser: 0.81 mg/dL (ref 0.44–1.00)
GFR, Estimated: >60 mL/min (ref >60)
Glucose, Bld: 103 mg/dL — ABNORMAL HIGH (ref 70–99)
Potassium: 4.4 mmol/L (ref 3.5–5.1)
Sodium: 137 mmol/L (ref 135–145)
Total Bilirubin: 0.5 mg/dL (ref 0.3–1.2)
Total Protein: 6.9 g/dL (ref 6.5–8.1)

## 2022-03-31 LAB — CBC
HCT: 43.9 % (ref 36.0–46.0)
Hemoglobin: 14.4 g/dL (ref 12.0–15.0)
MCH: 29 pg (ref 26.0–34.0)
MCHC: 32.8 g/dL (ref 30.0–36.0)
MCV: 88.5 fL (ref 80.0–100.0)
Platelets: 319 10*3/uL (ref 150–400)
RBC: 4.96 MIL/uL (ref 3.87–5.11)
RDW: 12.5 % (ref 11.5–15.5)
WBC: 8.9 10*3/uL (ref 4.0–10.5)
nRBC: 0 % (ref 0.0–0.2)

## 2022-03-31 LAB — LIPASE, BLOOD: Lipase: 35 U/L (ref 11–51)

## 2022-03-31 MED ORDER — ONDANSETRON 4 MG PO TBDP: ORAL_TABLET | ORAL | 0 refills | Status: AC

## 2022-03-31 MED ORDER — OXYCODONE HCL 5 MG PO TABS
2.5000 mg | ORAL_TABLET | Freq: Once | ORAL | Status: DC
Start: 2022-03-31 — End: 2022-03-31
  Filled 2022-03-31: qty 1

## 2022-03-31 MED ORDER — MORPHINE SULFATE 15 MG PO TABS: 7.5000 mg | ORAL_TABLET | ORAL | 0 refills | Status: AC | PRN

## 2022-03-31 NOTE — Discharge Instructions (Signed)
No concerning thing found on your CT or labs.  Please follow up with your family doc, GI doc.  Return for worsening pain, fever, inability to eat or drink.

## 2022-03-31 NOTE — ED Notes (Signed)
Discharge instructions and recommendations reviewed with patient. Pt refused pain medications. Questions answered. States understanding. Ambulatory and discharged to home

## 2022-03-31 NOTE — ED Provider Notes (Signed)
[facility] EMERGENC[facility] DEPARTMENT Provider Note   CSN: [id number] Arrival date & time: 03/31/22  1232     History  Chief Complaint  Patient presents with   Abdominal Pain    Anita [name] is a 81 y.o. female.  81 yo F with a cc of abdominal pain.  Worse to the lower abdomen.  Started this morning.  No fevers, no vomiting. No constipation or diarrhea.    Abdominal Pain      Home Medications Prior to Admission medications   Medication Sig Start Date End Date Taking? Authorizing Provider  morphine (MSIR) 15 MG tablet Take 0.5 tablets (7.5 mg total) by mouth every 4 (four) hours as needed for severe pain. 03/31/22  Yes Deno Etienne, DO  ondansetron (ZOFRAN-ODT) 4 MG disintegrating tablet '4mg'$  ODT q4 hours prn nausea/vomit 03/31/22  Yes Deno Etienne, DO  aspirin EC 325 MG tablet Take 325 mg by mouth as needed (pain/headache).     [provider]  fluticasone (FLONASE) 50 MCG/ACT nasal spray Place 1 spray into both nostrils as needed for allergies or rhinitis.    [provider]  guaiFENesin (MUCINEX) 600 MG 12 hr tablet Take by mouth 2 (two) times daily.    [provider]  levalbuterol Penne Lash HFA) 45 MCG/ACT inhaler Inhale 1-2 puffs into the lungs every 4 (four) hours as needed for wheezing. 01/13/17   Lysbeth Penner, FNP  omeprazole (PRILOSEC) 40 MG capsule Take 40 mg by mouth See admin instructions. Open capsule and sprinkle over applesauce once daily 10/06/16   [provider]  SYNTHROID 75 MCG tablet Take 75 mcg by mouth every morning. 08/01/21   [provider]  traMADol (ULTRAM) 50 MG tablet Take 1 tablet (50 mg total) by mouth every 6 (six) hours as needed for moderate pain or severe pain. 10/18/21   Daleen Bo, MD      Allergies    Clindamycin, Codeine, Minocin [minocycline], Peanut-containing drug products, Penicillins, Sulfa antibiotics, Sulfacetamide sodium, Iohexol, Latex, and Pseudoephedrine-naproxen na er     Review of Systems   Review of Systems  Gastrointestinal:  Positive for abdominal pain.    Physical Exam Updated Vital Signs BP (!) 159/74   Pulse 71   Temp 97.7 F (36.5 C) (Oral)   Resp 16   Ht 5' 1.5" (1.562 m)   Wt 56.7 kg   SpO2 99%   BMI 23.24 kg/m  Physical Exam Vitals and nursing note reviewed.  Constitutional:      General: She is not in acute distress.    Appearance: She is well-developed. She is not diaphoretic.  HENT:     Head: Normocephalic and atraumatic.  Eyes:     Pupils: Pupils are equal, round, and reactive to light.  Cardiovascular:     Rate and Rhythm: Normal rate and regular rhythm.     Heart sounds: No murmur heard.    No friction rub. No gallop.  Pulmonary:     Effort: Pulmonary effort is normal.     Breath sounds: No wheezing or rales.  Abdominal:     General: There is no distension.     Palpations: Abdomen is soft.     Tenderness: There is abdominal tenderness (mild diffuse tenderness).  Musculoskeletal:        General: No tenderness.     Cervical back: Normal range of motion and neck supple.  Skin:    General: Skin is warm and dry.  Neurological:     Mental  Status: She is alert and oriented to person, place, and time.  Psychiatric:        Behavior: Behavior normal.     ED Results / Procedures / Treatments   Labs (all labs ordered are listed, but only abnormal results are displayed) Labs Reviewed  COMPREHENSIVE METABOLIC PANEL - Abnormal; Notable for the following components:      Result Value   Glucose, Bld 103 (*)    All other components within normal limits  URINALYSIS, ROUTINE W REFLEX MICROSCOPIC - Abnormal; Notable for the following components:   Color, Urine STRAW (*)    All other components within normal limits  LIPASE, BLOOD  CBC    EKG None  Radiology CT ABDOMEN PELVIS WO CONTRAST  Result Date: 03/31/2022 CLINICAL DATA:  Abdominal pain and distension with nausea. History of kidney stones. EXAM: CT ABDOMEN AND  PELVIS WITHOUT CONTRAST TECHNIQUE: Multidetector CT imaging of the abdomen and pelvis was performed following the standard protocol without IV contrast. RADIATION DOSE REDUCTION: This exam was performed according to the departmental dose-optimization program which includes automated exposure control, adjustment of the mA and/or kV according to patient size and/or use of iterative reconstruction technique. COMPARISON:  06/10/2021. FINDINGS: Lower chest: Lung bases are clear. Heart is at the upper limits of normal in size. No pericardial or pleural effusion. Distal esophagus is grossly unremarkable. Hepatobiliary: 13 mm low-density lesion in the dome of the right hepatic lobe, unchanged dating back to at least the 11/17/2018, indicative of a cyst. Liver and gallbladder are unremarkable. No biliary ductal dilatation. Pancreas: Negative. Spleen: Negative. Adrenals/Urinary Tract: Adrenal glands are unremarkable. Stones in the right kidney. Kidneys are otherwise unremarkable. Ureters are decompressed. Bladder is relatively low in volume. Stomach/Bowel: Stomach and small bowel are unremarkable. Cecum is located in the pelvis. Appendix is not readily visualized. Colon is otherwise unremarkable. Vascular/Lymphatic: Atherosclerotic calcification of the aorta. No pathologically enlarged lymph nodes. Reproductive: Hysterectomy.  No adnexal mass. Other: Trace pelvic free fluid. Tiny periumbilical hernia contains fat. Mesenteries and peritoneum are otherwise unremarkable Musculoskeletal: Degenerative changes in the spine and hips. No worrisome lytic or sclerotic lesions. IMPRESSION: 1. No acute findings to explain the patient's clinical history. 2. Right renal stones. 3.  Aortic atherosclerosis (ICD10-I70.0). Electronically Signed   By: Lorin Picket M.D.   On: 03/31/2022 14:04    Procedures Procedures    Medications Ordered in ED Medications  oxyCODONE (Oxy IR/ROXICODONE) immediate release tablet 2.5 mg (has no  administration in time range)    ED Course/ Medical Decision Making/ A&P                           Medical Decision Making Risk Prescription drug management.   81 yo F with a cc of abdominal pain.  Going on just that this morning.  Has issues with abdominal pain in the past.  Is seen a GI doctor for this.  Has had a recent colonoscopy and endoscopy.  She tells me that the pain has been severe at times and she is worried that her kidney stone that is in her kidney had become dislodged.  Denies fevers denies vomiting denies diarrhea or constipation.  She thinks she had some bad broccoli last night.  Husband has had some abdominal discomfort and had the same meal.  Lab work here without leukocytosis no LFT elevation lipase is normal.  UA negative for infection is independently interpreted by me.  CT scan of the abdomen  pelvis without acute pathology.  We will have the patient follow-up with her PCP and GI.  Have return for worsening.   4:13 PM:  I have discussed the diagnosis/risks/treatment options with the patient and family.  Evaluation and diagnostic testing in the emergency department does not suggest an emergent condition requiring admission or immediate intervention beyond what has been performed at this time.  They will follow up with  PCP, GI. We also discussed returning to the ED immediately if new or worsening sx occur. We discussed the sx which are most concerning (e.g., sudden worsening pain, fever, inability to tolerate by mouth) that necessitate immediate return. Medications administered to the patient during their visit and any new prescriptions provided to the patient are listed below.  Medications given during this visit Medications  oxyCODONE (Oxy IR/ROXICODONE) immediate release tablet 2.5 mg (has no administration in time range)     The patient appears reasonably screen and/or stabilized for discharge and I doubt any other medical condition or other [facility] requiring further  screening, evaluation, or treatment in the ED at this time prior to discharge.          Final Clinical Impression(s) / ED Diagnoses Final diagnoses:  Generalized abdominal pain    Rx / DC Orders ED Discharge Orders          Ordered    morphine (MSIR) 15 MG tablet  Every 4 hours PRN        03/31/22 1608    ondansetron (ZOFRAN-ODT) 4 MG disintegrating tablet        03/31/22 Bancroft, [place], DO 03/31/22 1613

## 2022-03-31 NOTE — ED Triage Notes (Signed)
Generalized abd pain since 6am. Also reports abd distention.
